# Patient Record
Sex: Male | Born: 1955
Health system: Southern US, Community
[De-identification: ages and names within clinical notes are randomized; demographics above are authoritative.]

## PROBLEM LIST (undated history)

## (undated) DIAGNOSIS — M1990 Unspecified osteoarthritis, unspecified site: Secondary | ICD-10-CM

## (undated) DIAGNOSIS — Q433 Congenital malformations of intestinal fixation: Secondary | ICD-10-CM

## (undated) DIAGNOSIS — K219 Gastro-esophageal reflux disease without esophagitis: Secondary | ICD-10-CM

## (undated) HISTORY — DX: Congenital malformations of intestinal fixation: Q43.3

---

## 2011-03-10 HISTORY — PX: JOINT REPLACEMENT: SHX530

## 2013-06-22 ENCOUNTER — Ambulatory Visit (INDEPENDENT_AMBULATORY_CARE_PROVIDER_SITE_OTHER): Payer: BC Managed Care – PPO | Admitting: Sports Medicine

## 2013-06-22 ENCOUNTER — Encounter: Payer: Self-pay | Admitting: Sports Medicine

## 2013-06-22 VITALS — BP 131/84 | HR 105 | Wt 191.0 lb

## 2013-06-22 DIAGNOSIS — E039 Hypothyroidism, unspecified: Secondary | ICD-10-CM

## 2013-06-22 DIAGNOSIS — Z299 Encounter for prophylactic measures, unspecified: Secondary | ICD-10-CM | POA: Insufficient documentation

## 2013-06-22 DIAGNOSIS — Z136 Encounter for screening for cardiovascular disorders: Secondary | ICD-10-CM

## 2013-06-22 DIAGNOSIS — M1612 Unilateral primary osteoarthritis, left hip: Secondary | ICD-10-CM

## 2013-06-22 DIAGNOSIS — R6882 Decreased libido: Secondary | ICD-10-CM

## 2013-06-22 DIAGNOSIS — E291 Testicular hypofunction: Secondary | ICD-10-CM | POA: Insufficient documentation

## 2013-06-22 DIAGNOSIS — Z96649 Presence of unspecified artificial hip joint: Secondary | ICD-10-CM | POA: Insufficient documentation

## 2013-06-22 DIAGNOSIS — M1610 Unilateral primary osteoarthritis, unspecified hip: Secondary | ICD-10-CM

## 2013-06-22 DIAGNOSIS — M169 Osteoarthritis of hip, unspecified: Secondary | ICD-10-CM

## 2013-06-22 NOTE — Assessment & Plan Note (Addendum)
Status post right total hip arthroplasty. Left total hip arthroplasty scheduled for late December. He does need surgical clearance, he has greater than 4 METS of exercise tolerance, EKG is negative. Femoroacetabular joint injection as above. Return in 4 weeks.

## 2013-06-22 NOTE — Assessment & Plan Note (Signed)
Checking testosterone levels. Can consider 5 phosphodiesterase inhibitors if needed.

## 2013-06-22 NOTE — Progress Notes (Addendum)
  Subjective:    CC: Establish care.   HPI:  Hip osteoarthritis: Status post right total hip arthroplasty, scheduled for left total hip arthroplasty in December, currently has significant pain localized in his groin on the left side, moderate, persistent, and radiation. He does need surgical clearance for hip arthroplasty, he has greater than 4 METS of exercise tolerance without chest pain.  Decreased sex drive: Has trouble with quality, as well as duration of erections. This has occurred since his last hip replacement. He's never had testosterone levels checked, and is somewhat resistant to 5 phosphodiesterase inhibitor treatment.  Preventive measures: Due for lipids, declines flu shot.  Past medical history, Surgical history, Family history not pertinant except as noted below, Social history, Allergies, and medications have been entered into the medical record, reviewed, and no changes needed.   Review of Systems: No headache, visual changes, nausea, vomiting, diarrhea, constipation, dizziness, abdominal pain, skin rash, fevers, chills, night sweats, swollen lymph nodes, weight loss, chest pain, body aches, joint swelling, muscle aches, shortness of breath, mood changes, visual or auditory hallucinations.  Objective:    General: Well Developed, well nourished, and in no acute distress.  Neuro: Alert and oriented x3, extra-ocular muscles intact, sensation grossly intact.  HEENT: Normocephalic, atraumatic, pupils equal round reactive to light, neck supple, no masses, no lymphadenopathy, thyroid nonpalpable.  Skin: Warm and dry, no rashes noted.  Cardiac: Regular rate and rhythm, no murmurs rubs or gallops.  Respiratory: Clear to auscultation bilaterally. Not using accessory muscles, speaking in full sentences.  Abdominal: Soft, nontender, nondistended, positive bowel sounds, no masses, no organomegaly.  Left hip: Internal rotation to approximately 10, painful flexion, as well as internal  rotation.  Procedure: Real-time Ultrasound Guided Injection of left femoral acetabular joint Device: GE Logiq E  Verbal informed consent obtained.  Time-out conducted.   Noted no overlying erythema, induration, or other signs of local infection.  Skin prepped in a sterile fashion.  Local anesthesia: Topical Ethyl chloride.  With sterile technique and under real time ultrasound guidance:  2 cc Kenalog 40, 4 cc lidocaine injected easily at the femoral head/neck junction. Completed without difficulty  Pain immediately resolved suggesting accurate placement of the medication.  Advised to call if fevers/chills, erythema, induration, drainage, or persistent bleeding.  Images permanently stored and available for review in the ultrasound unit.  Impression: Technically successful ultrasound guided injection.  12-lead EKG shows normal sinus rhythm, with no ST changes, normal axis, normal rate.  Impression and Recommendations:    The patient was counselled, risk factors were discussed, anticipatory guidance given.

## 2013-06-24 ENCOUNTER — Telehealth: Payer: Self-pay | Admitting: *Deleted

## 2013-06-24 DIAGNOSIS — Z299 Encounter for prophylactic measures, unspecified: Secondary | ICD-10-CM

## 2013-07-02 DIAGNOSIS — E038 Other specified hypothyroidism: Secondary | ICD-10-CM | POA: Insufficient documentation

## 2013-07-02 DIAGNOSIS — E039 Hypothyroidism, unspecified: Secondary | ICD-10-CM | POA: Insufficient documentation

## 2013-07-02 LAB — COMPREHENSIVE METABOLIC PANEL
Albumin: 4.6 g/dL (ref 3.5–5.2)
CO2: 28 mEq/L (ref 19–32)
Calcium: 9.9 mg/dL (ref 8.4–10.5)
Chloride: 100 mEq/L (ref 96–112)
Glucose, Bld: 95 mg/dL (ref 70–99)
Potassium: 4.4 mEq/L (ref 3.5–5.3)
Sodium: 139 mEq/L (ref 135–145)
Total Protein: 7.9 g/dL (ref 6.0–8.3)

## 2013-07-02 LAB — COMPREHENSIVE METABOLIC PANEL WITH GFR
ALT: 26 U/L (ref 0–53)
AST: 22 U/L (ref 0–37)
Alkaline Phosphatase: 91 U/L (ref 39–117)
BUN: 13 mg/dL (ref 6–23)
Creat: 1.2 mg/dL (ref 0.50–1.35)
Total Bilirubin: 0.7 mg/dL (ref 0.3–1.2)

## 2013-07-02 LAB — CBC
HCT: 44.9 % (ref 39.0–52.0)
Hemoglobin: 15.8 g/dL (ref 13.0–17.0)
MCH: 31.5 pg (ref 26.0–34.0)
MCHC: 35.2 g/dL (ref 30.0–36.0)
MCV: 89.6 fL (ref 78.0–100.0)
Platelets: 280 10*3/uL (ref 150–400)
RBC: 5.01 MIL/uL (ref 4.22–5.81)
RDW: 14.1 % (ref 11.5–15.5)
WBC: 5.7 10*3/uL (ref 4.0–10.5)

## 2013-07-02 LAB — LIPID PANEL
Cholesterol: 197 mg/dL (ref 0–200)
HDL: 45 mg/dL (ref >39)
LDL Cholesterol: 129 mg/dL — ABNORMAL HIGH (ref 0–99)
Total CHOL/HDL Ratio: 4.4 ratio
Triglycerides: 115 mg/dL (ref <150)
VLDL: 23 mg/dL (ref 0–40)

## 2013-07-02 LAB — TSH: TSH: 4.606 u[IU]/mL — ABNORMAL HIGH (ref 0.350–4.500)

## 2013-07-05 LAB — TESTOSTERONE, FREE, TOTAL, SHBG
Sex Hormone Binding: 30 nmol/L (ref 13–71)
Testosterone, Free: 54.2 pg/mL (ref 47.0–244.0)
Testosterone-% Free: 2.1 % (ref 1.6–2.9)
Testosterone: 264 ng/dL — ABNORMAL LOW (ref 300–890)

## 2013-07-06 ENCOUNTER — Ambulatory Visit (INDEPENDENT_AMBULATORY_CARE_PROVIDER_SITE_OTHER): Payer: BC Managed Care – PPO | Admitting: Sports Medicine

## 2013-07-06 ENCOUNTER — Encounter: Payer: Self-pay | Admitting: Sports Medicine

## 2013-07-06 VITALS — BP 126/83 | HR 81 | Wt 187.0 lb

## 2013-07-06 DIAGNOSIS — M169 Osteoarthritis of hip, unspecified: Secondary | ICD-10-CM

## 2013-07-06 DIAGNOSIS — E291 Testicular hypofunction: Secondary | ICD-10-CM

## 2013-07-06 DIAGNOSIS — M1612 Unilateral primary osteoarthritis, left hip: Secondary | ICD-10-CM

## 2013-07-06 DIAGNOSIS — E039 Hypothyroidism, unspecified: Secondary | ICD-10-CM

## 2013-07-06 DIAGNOSIS — M1610 Unilateral primary osteoarthritis, unspecified hip: Secondary | ICD-10-CM

## 2013-07-06 DIAGNOSIS — E038 Other specified hypothyroidism: Secondary | ICD-10-CM

## 2013-07-06 MED ORDER — TESTOSTERONE 2 MG/24HR TD PT24: 1.0000 | MEDICATED_PATCH | Freq: Every morning | TRANSDERMAL | Status: DC

## 2013-07-06 NOTE — Assessment & Plan Note (Signed)
Pain is essentially resolved after injection, he can return as needed for repeat injection. He is scheduled for total hip arthroplasty on December 17.

## 2013-07-06 NOTE — Assessment & Plan Note (Signed)
Starting testosterone supplementation, topical patch. Prescribing Androderm, we may need to do a prior authorization. I would like to do one month of Androderm, and then recheck testosterone levels.

## 2013-07-06 NOTE — Progress Notes (Signed)
  Subjective:    CC:  Follow up  HPI: Hypogonadism: There he does have decreased sex drive, poor erections, we checked his testosterone levels and they were low. He does desire to initiate supplementation. He desires a topical patch.  Subclinical hypothyroidism: TSH was high, but he does not endorse any symptoms of fatigue, constipation, hair loss, cold intolerance.  Left hip osteoarthritis: scheduled for total hip arthroplasty in December, continues to be pain-free after femoroacetabular joint injection.  Past medical history, Surgical history, Family history not pertinant except as noted below, Social history, Allergies, and medications have been entered into the medical record, reviewed, and no changes needed.   Review of Systems: No fevers, chills, night sweats, weight loss, chest pain, or shortness of breath.   Objective:    General: Well Developed, well nourished, and in no acute distress.  Neuro: Alert and oriented x3, extra-ocular muscles intact, sensation grossly intact.  HEENT: Normocephalic, atraumatic, pupils equal round reactive to light, neck supple, no masses, no lymphadenopathy, thyroid nonpalpable.  Skin: Warm and dry, no rashes. Cardiac: Regular rate and rhythm, no murmurs rubs or gallops, no lower extremity edema.  Respiratory: Clear to auscultation bilaterally. Not using accessory muscles, speaking in full sentences.  Impression and Recommendations:

## 2013-07-06 NOTE — Assessment & Plan Note (Signed)
Currently asymptomatic, I would recommend that we do not treat this until symptomatic.

## 2013-07-20 ENCOUNTER — Ambulatory Visit (INDEPENDENT_AMBULATORY_CARE_PROVIDER_SITE_OTHER): Payer: BC Managed Care – PPO

## 2013-07-20 ENCOUNTER — Encounter: Payer: Self-pay | Admitting: Sports Medicine

## 2013-07-20 ENCOUNTER — Ambulatory Visit (INDEPENDENT_AMBULATORY_CARE_PROVIDER_SITE_OTHER): Payer: BC Managed Care – PPO | Admitting: Sports Medicine

## 2013-07-20 VITALS — BP 144/89 | HR 108 | Wt 188.0 lb

## 2013-07-20 DIAGNOSIS — M47812 Spondylosis without myelopathy or radiculopathy, cervical region: Secondary | ICD-10-CM | POA: Insufficient documentation

## 2013-07-20 DIAGNOSIS — M5030 Other cervical disc degeneration, unspecified cervical region: Secondary | ICD-10-CM

## 2013-07-20 DIAGNOSIS — E291 Testicular hypofunction: Secondary | ICD-10-CM

## 2013-07-20 DIAGNOSIS — M1612 Unilateral primary osteoarthritis, left hip: Secondary | ICD-10-CM

## 2013-07-20 DIAGNOSIS — M169 Osteoarthritis of hip, unspecified: Secondary | ICD-10-CM

## 2013-07-20 DIAGNOSIS — M1610 Unilateral primary osteoarthritis, unspecified hip: Secondary | ICD-10-CM

## 2013-07-20 DIAGNOSIS — M5380 Other specified dorsopathies, site unspecified: Secondary | ICD-10-CM

## 2013-07-20 DIAGNOSIS — M542 Cervicalgia: Secondary | ICD-10-CM

## 2013-07-20 MED ORDER — TESTOSTERONE CYPIONATE 200 MG/ML IM SOLN
200.0000 mg | INTRAMUSCULAR | Status: DC
  Administered 2013-07-20: 200 mg via INTRAMUSCULAR

## 2013-07-20 NOTE — Assessment & Plan Note (Signed)
Last injection was almost 3 months ago, he is starting to have 50% return of his pain. Repeat femoral acetabular joint injection as above. Total hip arthroplasty is scheduled for the middle of December.

## 2013-07-20 NOTE — Assessment & Plan Note (Signed)
Testosterone patches were too expensive. Switching to injections every 2 weeks. We will inject him twice, and then recheck testosterone levels one week after his second injection.

## 2013-07-20 NOTE — Assessment & Plan Note (Signed)
This likely represents upper cervical degenerative disc disease. X-rays. He has been through a full course of chiropractic care, with no improvement. I do suspect he would be a candidate for interventional injections. He's not yet ready to proceed with MRI but when he is, I am happy to order it, he will need 10 mg of Valium prior to the procedure.

## 2013-07-20 NOTE — Progress Notes (Signed)
  Subjective:    CC: Follow up  HPI: Left hip osteoarthritis: Has an arthroplasty scheduled for December. Unfortunately he is starting to have a recurrence of pain approximately 2 and half months after his last injection. He desires repeat interventional treatment.  Male hypogonadism:  Was unable to afford Androderm, he does desire to proceed with testosterone injections.  Neck pain: Gets a grinding sensation, pain radiates down both shoulder blades. Moderate, persistent.  Past medical history, Surgical history, Family history not pertinant except as noted below, Social history, Allergies, and medications have been entered into the medical record, reviewed, and no changes needed.   Review of Systems: No fevers, chills, night sweats, weight loss, chest pain, or shortness of breath.   Objective:    General: Well Developed, well nourished, and in no acute distress.  Neuro: Alert and oriented x3, extra-ocular muscles intact, sensation grossly intact.  HEENT: Normocephalic, atraumatic, pupils equal round reactive to light, neck supple, no masses, no lymphadenopathy, thyroid nonpalpable.  Skin: Warm and dry, no rashes. Cardiac: Regular rate and rhythm, no murmurs rubs or gallops, no lower extremity edema.  Respiratory: Clear to auscultation bilaterally. Not using accessory muscles, speaking in full sentences. Neck: Inspection unremarkable. No palpable stepoffs. Negative Spurling's maneuver. Full neck range of motion Grip strength and sensation normal in bilateral hands Strength good C4 to T1 distribution No sensory change to C4 to T1 Negative Hoffman sign bilaterally Reflexes normal  Procedure: Real-time Ultrasound Guided Injection of left hip Device: GE Logiq E  Verbal informed consent obtained.  Time-out conducted.  Noted no overlying erythema, induration, or other signs of local infection.  Skin prepped in a sterile fashion.  Local anesthesia: Topical Ethyl chloride.  With  sterile technique and under real time ultrasound guidance:  Spinal needle advanced to the femoral head/neck junction, 2 cc Kenalog 40, 4 cc lidocaine injected easily into the joint. Completed without difficulty  Pain immediately resolved suggesting accurate placement of the medication.  Advised to call if fevers/chills, erythema, induration, drainage, or persistent bleeding.  Images permanently stored and available for review in the ultrasound unit.  Impression: Technically successful ultrasound guided injection.  Impression and Recommendations:

## 2013-07-29 ENCOUNTER — Ambulatory Visit (INDEPENDENT_AMBULATORY_CARE_PROVIDER_SITE_OTHER): Payer: BC Managed Care – PPO | Admitting: Sports Medicine

## 2013-07-29 ENCOUNTER — Encounter: Payer: Self-pay | Admitting: Sports Medicine

## 2013-07-29 VITALS — BP 125/75 | HR 90 | Wt 188.0 lb

## 2013-07-29 DIAGNOSIS — M1612 Unilateral primary osteoarthritis, left hip: Secondary | ICD-10-CM

## 2013-07-29 DIAGNOSIS — M1610 Unilateral primary osteoarthritis, unspecified hip: Secondary | ICD-10-CM

## 2013-07-29 DIAGNOSIS — M542 Cervicalgia: Secondary | ICD-10-CM

## 2013-07-29 DIAGNOSIS — M169 Osteoarthritis of hip, unspecified: Secondary | ICD-10-CM

## 2013-07-29 DIAGNOSIS — E291 Testicular hypofunction: Secondary | ICD-10-CM

## 2013-07-29 MED ORDER — TESTOSTERONE CYPIONATE 200 MG/ML IM SOLN
200.0000 mg | INTRAMUSCULAR | Status: DC
  Administered 2013-07-29: 200 mg via INTRAMUSCULAR

## 2013-07-29 MED ORDER — CYCLOBENZAPRINE HCL 10 MG PO TABS: ORAL_TABLET | ORAL | Status: DC

## 2013-07-29 MED ORDER — MELOXICAM 15 MG PO TABS: ORAL_TABLET | ORAL | Status: DC

## 2013-07-29 NOTE — Progress Notes (Signed)
  Subjective:    CC: Followup  HPI: Neck pain: Gets a grinding sensation in the upper cervical spine as well as pain in the morning. Symptoms radiate down into both shoulder blades, but not into the arms. Symptoms are moderate, persistent.  Hypogonadism:  Due for testosterone injection today.  Left hip osteoarthritis: Doing well after injection, still scheduled for arthroplasty.  Past medical history, Surgical history, Family history not pertinant except as noted below, Social history, Allergies, and medications have been entered into the medical record, reviewed, and no changes needed.   Review of Systems: No fevers, chills, night sweats, weight loss, chest pain, or shortness of breath.   Objective:    General: Well Developed, well nourished, and in no acute distress.  Neuro: Alert and oriented x3, extra-ocular muscles intact, sensation grossly intact.  HEENT: Normocephalic, atraumatic, pupils equal round reactive to light, neck supple, no masses, no lymphadenopathy, thyroid nonpalpable.  Skin: Warm and dry, no rashes. Cardiac: Regular rate and rhythm, no murmurs rubs or gallops, no lower extremity edema.  Respiratory: Clear to auscultation bilaterally. Not using accessory muscles, speaking in full sentences. Neck: Inspection unremarkable. No palpable stepoffs. Negative Spurling's maneuver. Full neck range of motion Grip strength and sensation normal in bilateral hands Strength good C4 to T1 distribution No sensory change to C4 to T1 Negative Hoffman sign bilaterally Reflexes normal  X-ray showed mild C5-C6 degenerative disc disease.  Impression and Recommendations:

## 2013-07-29 NOTE — Assessment & Plan Note (Addendum)
Hip is doing fantastic after injection.

## 2013-07-29 NOTE — Assessment & Plan Note (Signed)
Mobic, Flexeril, home exercises. Return in one month, MRI for interventional injection planning if no better, he likely has some facet spondylosis.

## 2013-07-29 NOTE — Assessment & Plan Note (Signed)
Testosterone injection as above. 

## 2013-08-10 ENCOUNTER — Other Ambulatory Visit: Payer: Self-pay | Admitting: Orthopedic Surgery

## 2013-08-10 NOTE — Progress Notes (Signed)
Preoperative surgical orders have been place into the Epic hospital system for Benjamin Ballard on 08/10/2013, 12:58 PM  by Patrica Duel for surgery on 08-25-2013.  Preop Total Hip - Anterior Approach orders including Experel Injecion, PO Tylenol, and IV Decadron as long as there are no contraindications to the above medications. Avel Peace, PA-C

## 2013-08-12 ENCOUNTER — Ambulatory Visit (INDEPENDENT_AMBULATORY_CARE_PROVIDER_SITE_OTHER): Payer: BC Managed Care – PPO | Admitting: Sports Medicine

## 2013-08-12 ENCOUNTER — Encounter: Payer: Self-pay | Admitting: *Deleted

## 2013-08-12 VITALS — BP 134/81 | HR 96

## 2013-08-12 DIAGNOSIS — E291 Testicular hypofunction: Secondary | ICD-10-CM

## 2013-08-12 MED ORDER — TESTOSTERONE CYPIONATE 200 MG/ML IM SOLN
200.0000 mg | Freq: Once | INTRAMUSCULAR | Status: AC
  Administered 2013-08-12: 200 mg via INTRAMUSCULAR

## 2013-08-12 NOTE — Assessment & Plan Note (Signed)
Testosterone injection as above. 

## 2013-08-12 NOTE — Progress Notes (Signed)
   Subjective:    Patient ID: Benjamin Ballard, male    DOB: 03-Sep-1956, 57 y.o.   MRN: [id number] Testosterone given LUOQ 200mg .  No complications.  Donne Anon, CMA HPI    Review of Systems     Objective:   Physical Exam        Assessment & Plan:

## 2013-08-16 ENCOUNTER — Encounter (HOSPITAL_COMMUNITY): Payer: Self-pay | Admitting: Pharmacy Technician

## 2013-08-18 ENCOUNTER — Encounter (HOSPITAL_COMMUNITY): Payer: Self-pay

## 2013-08-18 ENCOUNTER — Encounter (HOSPITAL_COMMUNITY)
Admission: RE | Admit: 2013-08-18 | Discharge: 2013-08-18 | Disposition: A | Discharge status: Home / Self Care | Payer: BC Managed Care – PPO | Source: Ambulatory Visit | Attending: Orthopedic Surgery | Admitting: Orthopedic Surgery

## 2013-08-18 ENCOUNTER — Other Ambulatory Visit (HOSPITAL_COMMUNITY): Payer: Self-pay | Admitting: Orthopedic Surgery

## 2013-08-18 ENCOUNTER — Ambulatory Visit (HOSPITAL_COMMUNITY)
Admission: RE | Admit: 2013-08-18 | Discharge: 2013-08-18 | Disposition: A | Discharge status: Home / Self Care | Payer: BC Managed Care – PPO | Source: Ambulatory Visit | Attending: Orthopedic Surgery | Admitting: Orthopedic Surgery

## 2013-08-18 DIAGNOSIS — M76899 Other specified enthesopathies of unspecified lower limb, excluding foot: Secondary | ICD-10-CM | POA: Insufficient documentation

## 2013-08-18 DIAGNOSIS — I998 Other disorder of circulatory system: Secondary | ICD-10-CM | POA: Insufficient documentation

## 2013-08-18 DIAGNOSIS — Z96649 Presence of unspecified artificial hip joint: Secondary | ICD-10-CM | POA: Insufficient documentation

## 2013-08-18 DIAGNOSIS — Z01818 Encounter for other preprocedural examination: Secondary | ICD-10-CM | POA: Insufficient documentation

## 2013-08-18 DIAGNOSIS — M1610 Unilateral primary osteoarthritis, unspecified hip: Secondary | ICD-10-CM | POA: Insufficient documentation

## 2013-08-18 DIAGNOSIS — Z01812 Encounter for preprocedural laboratory examination: Secondary | ICD-10-CM | POA: Insufficient documentation

## 2013-08-18 DIAGNOSIS — M169 Osteoarthritis of hip, unspecified: Secondary | ICD-10-CM | POA: Insufficient documentation

## 2013-08-18 HISTORY — DX: Unspecified osteoarthritis, unspecified site: M19.90

## 2013-08-18 HISTORY — DX: Gastro-esophageal reflux disease without esophagitis: K21.9

## 2013-08-18 LAB — URINALYSIS, ROUTINE W REFLEX MICROSCOPIC
Bilirubin Urine: NEGATIVE
Glucose, UA: NEGATIVE mg/dL
Hgb urine dipstick: NEGATIVE
Leukocytes, UA: NEGATIVE
Specific Gravity, Urine: 1.007 (ref 1.005–1.030)
Urobilinogen, UA: 0.2 mg/dL (ref 0.0–1.0)

## 2013-08-18 LAB — COMPREHENSIVE METABOLIC PANEL
ALT: 24 U/L (ref 0–53)
Albumin: 4.3 g/dL (ref 3.5–5.2)
Alkaline Phosphatase: 92 U/L (ref 39–117)
BUN: 9 mg/dL (ref 6–23)
CO2: 27 mEq/L (ref 19–32)
Chloride: 102 mEq/L (ref 96–112)
Creatinine, Ser: 0.97 mg/dL (ref 0.50–1.35)
Glucose, Bld: 81 mg/dL (ref 70–99)
Potassium: 4.1 mEq/L (ref 3.5–5.1)
Sodium: 138 mEq/L (ref 135–145)
Total Bilirubin: 0.4 mg/dL (ref 0.3–1.2)
Total Protein: 7.6 g/dL (ref 6.0–8.3)

## 2013-08-18 LAB — CBC
HCT: 47.2 % (ref 39.0–52.0)
Hemoglobin: 15.8 g/dL (ref 13.0–17.0)
MCHC: 33.5 g/dL (ref 30.0–36.0)
MCV: 95.2 fL (ref 78.0–100.0)
RDW: 13.7 % (ref 11.5–15.5)
WBC: 6.5 10*3/uL (ref 4.0–10.5)

## 2013-08-18 LAB — APTT: aPTT: 31 seconds (ref 24–37)

## 2013-08-18 LAB — SURGICAL PCR SCREEN
MRSA, PCR: NEGATIVE
Staphylococcus aureus: POSITIVE — AB

## 2013-08-18 NOTE — Patient Instructions (Addendum)
20 LEVELL TAVANO  08/18/2013   Your procedure is scheduled on: Wednesday December 17th  Report to [facility] at 630  AM.  Call this number if you have problems the morning of surgery [phone]   Remember:   Do not eat food or drink liquids :After Midnight.     Take these medicines the morning of surgery with A SIP OF WATER: no meds to take                                SEE [place] PREPARING FOR SURGERY SHEET             You may not have any metal on your body including hair pins and piercings  Do not wear jewelry, make-up.  Do not wear lotions, powders, or perfumes. You may wear deodorant.   Men may shave face and neck.  Do not bring valuables to the hospital. [facility] IS NOT RESPONSIBLE FOR VALUEABLES.  Contacts, dentures or bridgework may not be worn into surgery.  Leave suitcase in the car. After surgery it may be brought to your room.  For patients admitted to the hospital, checkout time is 11:00 AM the day of discharge.    Please read over the following fact sheets that you were given:                          [facility] Preparing for Surgery sheet                          MRSA Information, Incentive Spirometer sheet                           Blood Fact Sheet   Call Cain Sieve RN pre op nurse if needed [phone]    FAILURE TO FOLLOW THESE INSTRUCTIONS MAY RESULT IN THE CANCELLATION OF YOUR SURGERY.  PATIENT SIGNATURE___________________________________________  NURSE SIGNATURE_____________________________________________

## 2013-08-24 ENCOUNTER — Other Ambulatory Visit: Payer: Self-pay | Admitting: Orthopedic Surgery

## 2013-08-24 NOTE — H&P (Signed)
Benjamin Ballard  DOB: 08/28/1956 Married / Language: English / Race: White Male  Date of Admission:  08-25-2013  Chief Complaint:  Left Hip Pain  History of Present Illness The patient is a 57 year old male who comes in for a preoperative History and Physical. The patient is scheduled for a left total hip arthroplasty (anterior approach) to be performed by Dr. Frank V. Aluisio, MD at [facility] on 08/25/2013.  The patient is a 57 year old male who presents with a hip problem. The patient reports left hip problems including pain symptoms that have been present for several month(s). The symptoms began without any known injury. Symptoms reported include hip pain, catching and difficulty ambulating The patient does not report any radiation of symptoms. Onset of symptoms was gradual.The patient feels as if their symptoms are does feel they are worsening. Symptoms are exacerbated by lying on the affected side. Current treatment includes nonsteroidal anti-inflammatory drugs (occasionally).  The patient previously had right hip resurfacing by Dr. Ward when he was still at [facility]. The patient states he did very well with that. He recovered quickly. He is having similar symptoms now in his left hip. He is having pain with most activities. He even has pain at rest. It is starting to limit what he can and cannot do. He is at a stage where he is wanting to pursue treatment for his left hip. He is ready to proceed with hip repalcement. They have been treated conservatively in the past for the above stated problem and despite conservative measures, they continue to have progressive pain and severe functional limitations and dysfunction. They have failed non-operative management including home exercise, medications, and injections. It is felt that they would benefit from undergoing total joint replacement. Risks and benefits of the procedure have been discussed with the patient and they  elect to proceed with surgery. There are no active contraindications to surgery such as ongoing infection or rapidly progressive neurological disease.    Problem List/Past Medical Osteoarthritis, Hip (715.35) Hip pain (719.45)    Allergies OxyCODONE HCl *ANALGESICS - OPIOID*. Difficulty breathing, Rash. Penicillins. childhood reaction    Family History Father. Deceased. Colon Cancer Mother. Living. Heart Attack, Cardiac Stents    Social History Number of flights of stairs before winded. 2-3 Pain Contract. no Tobacco use. Never smoker. never smoker Marital status. married Children. 1 Current work status. working full time Alcohol use. never consumed alcohol Drug/Alcohol Rehab (Currently). no Illicit drug use. no Living situation. live with spouse Drug/Alcohol Rehab (Previously). no Exercise. Exercises weekly; does other    Medication History Multivitamins ( Oral) Active. Goodys Extra Strength ( Oral) Specific dose unknown - Active. Vitamin C ( Oral) Active.    Past Surgical History Right hip resurfacing by Dr. Ward in July 2012   Review of Systems General:Not Present- Chills, Fever, Night Sweats, Fatigue, Weight Gain, Weight Loss and Memory Loss. Skin:Not Present- Hives, Itching, Rash, Eczema and Lesions. HEENT:Not Present- Tinnitus, Headache, Double Vision, Visual Loss, Hearing Loss and Dentures. Respiratory:Not Present- Shortness of breath with exertion, Shortness of breath at rest, Allergies, Coughing up blood and Chronic Cough. Cardiovascular:Not Present- Chest Pain, Racing/skipping heartbeats, Difficulty Breathing Lying Down, Murmur, Swelling and Palpitations. Gastrointestinal:Not Present- Bloody Stool, Heartburn, Abdominal Pain, Vomiting, Nausea, Constipation, Diarrhea, Difficulty Swallowing, Jaundice and Loss of appetitie. Male Genitourinary:Not Present- Urinary frequency, Blood in Urine, Weak urinary stream, Discharge,  Flank Pain, Incontinence, Painful Urination, Urgency, Urinary Retention and Urinating at Night. Musculoskeletal:Present- Joint Pain. Not   Present- Muscle Weakness, Muscle Pain, Joint Swelling, Back Pain, Morning Stiffness and Spasms. Neurological:Not Present- Tremor, Dizziness, Blackout spells, Paralysis, Difficulty with balance and Weakness. Psychiatric:Not Present- Insomnia.    Vitals Weight: 188 lb Height: 73 in Weight was reported by patient. Height was reported by patient. Body Surface Area: 2.1 m Body Mass Index: 24.8 kg/m Pulse: 88 (Regular) Resp.: 14 (Unlabored) BP: 124/78 (Sitting, Left Arm, Standard)   Physical Exam The physical exam findings are as follows:  Note: Patient is a 57 year old male with continued hip pain.   General Mental Status - Alert, cooperative and good historian. General Appearance- pleasant. Not in acute distress. Orientation- Oriented X3. Build & Nutrition- Well nourished and Well developed.   Head and Neck Head- normocephalic, atraumatic . Neck Global Assessment- supple. no bruit auscultated on the right and no bruit auscultated on the left.   Eye Vision- Wears corrective lenses. Pupil- Bilateral- Regular and Round. Motion- Bilateral- EOMI.   Chest and Lung Exam Auscultation: Breath sounds:- clear at anterior chest wall and - clear at posterior chest wall. Adventitious sounds:- No Adventitious sounds.   Cardiovascular Auscultation:Rhythm- Regular rate and rhythm (with an occassional "ectopic" beat heard on exam). Heart Sounds- S1 WNL and S2 WNL. Murmurs & Other Heart Sounds:Auscultation of the heart reveals - No Murmurs.   Abdomen Palpation/Percussion:Tenderness- Abdomen is non-tender to palpation. Rigidity (guarding)- Abdomen is soft. Auscultation:Auscultation of the abdomen reveals - Bowel sounds normal.   Male Genitourinary Not done, not pertinent to present  illness  Musculoskeletal On exam well developed male, alert, and oriented in no apparent distress. Evaluation of his right hip flexion about 100, rotation in 20, out 30, and abduction 30 without discomfort. The left hip flexion 90, no internal rotation, about 5 degrees external rotation, about 5-10 degrees of abduction. His gait pattern is antalgic on the left. Pulse sensation and motor intact both lower extremities.  RADIOGRAPHS: AP pelvis and lateral of the left hip show the resurfacing in good position on the right. On the left he has bone on bone arthritis throughout the hip. There is flattening of the femoral head and osteophyte formation.   Assessment & Plan Osteoarthritis, Hip (715.35) Impression: Left Hip  Note: Plan is for a LeftTotal Hip Replacement - Anterior Approach by Dr. Aluisio.  Plan is to go home.  PCP - Dr. Tekkekandon - Patient has been seen preoperatively and felt to be stable for surgery.  The patient does not have any contraindications and will receive TXA (tranexamic acid) prior to surgery.  Signed electronically by Rameen Quinney L Jashawna Reever, III PA-C  

## 2013-08-24 NOTE — Anesthesia Preprocedure Evaluation (Addendum)
Anesthesia Evaluation  Patient identified by MRN, date of birth, ID band Patient awake    Reviewed: Allergy & Precautions, H&P , NPO status , Patient's Chart, lab work & pertinent test results  Airway Mallampati: II TM Distance: >3 FB Neck ROM: Full    Dental  (+) Teeth Intact and Dental Advisory Given   Pulmonary neg pulmonary ROS,  breath sounds clear to auscultation  Pulmonary exam normal       Cardiovascular negative cardio ROS  Rhythm:Regular Rate:Normal     Neuro/Psych negative neurological ROS  negative psych ROS   GI/Hepatic Neg liver ROS, GERD-  ,  Endo/Other  negative endocrine ROS  Renal/GU negative Renal ROS  negative genitourinary   Musculoskeletal  (+) Arthritis -,   Abdominal   Peds  Hematology negative hematology ROS (+)   Anesthesia Other Findings   Reproductive/Obstetrics                          Anesthesia Physical Anesthesia Plan  ASA: I  Anesthesia Plan: General   Post-op Pain Management:    Induction: Intravenous  Airway Management Planned: Oral ETT  Additional Equipment:   Intra-op Plan:   Post-operative Plan: Extubation in OR  Informed Consent: I have reviewed the patients History and Physical, chart, labs and discussed the procedure including the risks, benefits and alternatives for the proposed anesthesia with the patient or authorized representative who has indicated his/her understanding and acceptance.   Dental advisory given  Plan Discussed with: CRNA  Anesthesia Plan Comments:        Anesthesia Quick Evaluation

## 2013-08-25 ENCOUNTER — Inpatient Hospital Stay (HOSPITAL_COMMUNITY): Payer: BC Managed Care – PPO

## 2013-08-25 ENCOUNTER — Inpatient Hospital Stay (HOSPITAL_COMMUNITY)
Admission: RE | Admit: 2013-08-25 | Discharge: 2013-08-26 | DRG: 470 | Disposition: A | Discharge status: Home Health | Payer: BC Managed Care – PPO | Source: Ambulatory Visit | Attending: Orthopedic Surgery | Admitting: Orthopedic Surgery

## 2013-08-25 ENCOUNTER — Encounter (HOSPITAL_COMMUNITY)
Admission: RE | Disposition: A | Discharge status: Home Health | Payer: Self-pay | Source: Ambulatory Visit | Attending: Orthopedic Surgery

## 2013-08-25 ENCOUNTER — Encounter (HOSPITAL_COMMUNITY): Payer: Self-pay | Admitting: *Deleted

## 2013-08-25 ENCOUNTER — Encounter (HOSPITAL_COMMUNITY): Payer: BC Managed Care – PPO | Admitting: Anesthesiology

## 2013-08-25 ENCOUNTER — Inpatient Hospital Stay (HOSPITAL_COMMUNITY): Payer: BC Managed Care – PPO | Admitting: Anesthesiology

## 2013-08-25 DIAGNOSIS — M1610 Unilateral primary osteoarthritis, unspecified hip: Principal | ICD-10-CM | POA: Diagnosis present

## 2013-08-25 DIAGNOSIS — K219 Gastro-esophageal reflux disease without esophagitis: Secondary | ICD-10-CM | POA: Diagnosis present

## 2013-08-25 DIAGNOSIS — Z96649 Presence of unspecified artificial hip joint: Secondary | ICD-10-CM

## 2013-08-25 DIAGNOSIS — E871 Hypo-osmolality and hyponatremia: Secondary | ICD-10-CM | POA: Diagnosis not present

## 2013-08-25 DIAGNOSIS — Z79899 Other long term (current) drug therapy: Secondary | ICD-10-CM

## 2013-08-25 DIAGNOSIS — Z880 Allergy status to penicillin: Secondary | ICD-10-CM

## 2013-08-25 DIAGNOSIS — Z800 Family history of malignant neoplasm of digestive organs: Secondary | ICD-10-CM

## 2013-08-25 DIAGNOSIS — D62 Acute posthemorrhagic anemia: Secondary | ICD-10-CM | POA: Diagnosis not present

## 2013-08-25 DIAGNOSIS — Z8249 Family history of ischemic heart disease and other diseases of the circulatory system: Secondary | ICD-10-CM

## 2013-08-25 DIAGNOSIS — M169 Osteoarthritis of hip, unspecified: Secondary | ICD-10-CM | POA: Diagnosis present

## 2013-08-25 HISTORY — PX: TOTAL HIP ARTHROPLASTY: SHX124

## 2013-08-25 LAB — TYPE AND SCREEN
ABO/RH(D): O POS
Antibody Screen: NEGATIVE

## 2013-08-25 LAB — ABO/RH: ABO/RH(D): O POS

## 2013-08-25 SURGERY — ARTHROPLASTY, HIP, TOTAL, ANTERIOR APPROACH
Anesthesia: General | Site: Hip | Laterality: Left

## 2013-08-25 MED ORDER — DEXAMETHASONE SODIUM PHOSPHATE 10 MG/ML IJ SOLN: 10.0000 mg | Freq: Once | INTRAMUSCULAR | Status: DC

## 2013-08-25 MED ORDER — CEFAZOLIN SODIUM-DEXTROSE 2-3 GM-% IV SOLR
INTRAVENOUS | Status: AC
  Filled 2013-08-25: qty 50

## 2013-08-25 MED ORDER — MIDAZOLAM HCL 5 MG/5ML IJ SOLN
INTRAMUSCULAR | Status: DC | PRN
  Administered 2013-08-25: 2 mg via INTRAVENOUS

## 2013-08-25 MED ORDER — ACETAMINOPHEN 500 MG PO TABS
1000.0000 mg | ORAL_TABLET | Freq: Four times a day (QID) | ORAL | Status: DC
  Administered 2013-08-25 – 2013-08-26 (×3): 1000 mg via ORAL
  Filled 2013-08-25 (×3): qty 2

## 2013-08-25 MED ORDER — ONDANSETRON HCL 4 MG/2ML IJ SOLN: 4.0000 mg | Freq: Four times a day (QID) | INTRAMUSCULAR | Status: DC | PRN

## 2013-08-25 MED ORDER — BUPIVACAINE HCL 0.25 % IJ SOLN
INTRAMUSCULAR | Status: DC | PRN
  Administered 2013-08-25: 20 mL

## 2013-08-25 MED ORDER — DOCUSATE SODIUM 100 MG PO CAPS
100.0000 mg | ORAL_CAPSULE | Freq: Two times a day (BID) | ORAL | Status: DC
  Administered 2013-08-25 – 2013-08-26 (×2): 100 mg via ORAL

## 2013-08-25 MED ORDER — GLYCOPYRROLATE 0.2 MG/ML IJ SOLN
INTRAMUSCULAR | Status: AC
  Filled 2013-08-25: qty 2

## 2013-08-25 MED ORDER — LACTATED RINGERS IV SOLN
INTRAVENOUS | Status: DC | PRN
  Administered 2013-08-25 (×2): via INTRAVENOUS

## 2013-08-25 MED ORDER — BISACODYL 10 MG RE SUPP: 10.0000 mg | Freq: Every day | RECTAL | Status: DC | PRN

## 2013-08-25 MED ORDER — FENTANYL CITRATE 0.05 MG/ML IJ SOLN
INTRAMUSCULAR | Status: AC
  Filled 2013-08-25: qty 5

## 2013-08-25 MED ORDER — HYDROMORPHONE HCL PF 1 MG/ML IJ SOLN
INTRAMUSCULAR | Status: AC
  Filled 2013-08-25: qty 1

## 2013-08-25 MED ORDER — METOCLOPRAMIDE HCL 5 MG/ML IJ SOLN: 5.0000 mg | Freq: Three times a day (TID) | INTRAMUSCULAR | Status: DC | PRN

## 2013-08-25 MED ORDER — GLYCOPYRROLATE 0.2 MG/ML IJ SOLN
INTRAMUSCULAR | Status: DC | PRN
  Administered 2013-08-25: 0.6 mg via INTRAVENOUS

## 2013-08-25 MED ORDER — MENTHOL 3 MG MT LOZG: 1.0000 | LOZENGE | OROMUCOSAL | Status: DC | PRN

## 2013-08-25 MED ORDER — TRANEXAMIC ACID 100 MG/ML IV SOLN
1000.0000 mg | INTRAVENOUS | Status: AC
  Administered 2013-08-25: 1000 mg via INTRAVENOUS
  Filled 2013-08-25: qty 10

## 2013-08-25 MED ORDER — MIDAZOLAM HCL 2 MG/2ML IJ SOLN
INTRAMUSCULAR | Status: AC
  Filled 2013-08-25: qty 2

## 2013-08-25 MED ORDER — LIDOCAINE HCL (CARDIAC) 20 MG/ML IV SOLN
INTRAVENOUS | Status: AC
  Filled 2013-08-25: qty 5

## 2013-08-25 MED ORDER — ROCURONIUM BROMIDE 100 MG/10ML IV SOLN
INTRAVENOUS | Status: DC | PRN
  Administered 2013-08-25: 30 mg via INTRAVENOUS

## 2013-08-25 MED ORDER — ONDANSETRON HCL 4 MG/2ML IJ SOLN
INTRAMUSCULAR | Status: DC | PRN
  Administered 2013-08-25: 4 mg via INTRAVENOUS

## 2013-08-25 MED ORDER — DEXAMETHASONE SODIUM PHOSPHATE 10 MG/ML IJ SOLN
10.0000 mg | Freq: Every day | INTRAMUSCULAR | Status: AC
  Filled 2013-08-25: qty 1

## 2013-08-25 MED ORDER — 0.9 % SODIUM CHLORIDE (POUR BTL) OPTIME
TOPICAL | Status: DC | PRN
  Administered 2013-08-25: 1000 mL

## 2013-08-25 MED ORDER — CEFAZOLIN SODIUM-DEXTROSE 2-3 GM-% IV SOLR
2.0000 g | Freq: Four times a day (QID) | INTRAVENOUS | Status: AC
Start: 2013-08-25 — End: 2013-08-25
  Administered 2013-08-25 (×2): 2 g via INTRAVENOUS
  Filled 2013-08-25 (×2): qty 50

## 2013-08-25 MED ORDER — MORPHINE SULFATE 2 MG/ML IJ SOLN: 1.0000 mg | INTRAMUSCULAR | Status: DC | PRN

## 2013-08-25 MED ORDER — DEXTROSE-NACL 5-0.9 % IV SOLN
INTRAVENOUS | Status: DC
  Administered 2013-08-25: 15:00:00 via INTRAVENOUS

## 2013-08-25 MED ORDER — SODIUM CHLORIDE 0.9 % IV SOLN: INTRAVENOUS | Status: DC

## 2013-08-25 MED ORDER — SODIUM CHLORIDE 0.9 % IJ SOLN
INTRAMUSCULAR | Status: AC
  Filled 2013-08-25: qty 50

## 2013-08-25 MED ORDER — POLYETHYLENE GLYCOL 3350 17 G PO PACK: 17.0000 g | PACK | Freq: Every day | ORAL | Status: DC | PRN

## 2013-08-25 MED ORDER — ACETAMINOPHEN 500 MG PO TABS
1000.0000 mg | ORAL_TABLET | Freq: Once | ORAL | Status: AC
  Administered 2013-08-25: 1000 mg via ORAL
  Filled 2013-08-25: qty 2

## 2013-08-25 MED ORDER — METHOCARBAMOL 100 MG/ML IJ SOLN
500.0000 mg | Freq: Four times a day (QID) | INTRAVENOUS | Status: DC | PRN
  Administered 2013-08-25: 500 mg via INTRAVENOUS
  Filled 2013-08-25: qty 5

## 2013-08-25 MED ORDER — ACETAMINOPHEN 650 MG RE SUPP: 650.0000 mg | Freq: Four times a day (QID) | RECTAL | Status: DC | PRN

## 2013-08-25 MED ORDER — SUCCINYLCHOLINE CHLORIDE 20 MG/ML IJ SOLN
INTRAMUSCULAR | Status: AC
  Filled 2013-08-25: qty 1

## 2013-08-25 MED ORDER — METHOCARBAMOL 500 MG PO TABS: 500.0000 mg | ORAL_TABLET | Freq: Four times a day (QID) | ORAL | Status: DC | PRN

## 2013-08-25 MED ORDER — DEXAMETHASONE SODIUM PHOSPHATE 10 MG/ML IJ SOLN
INTRAMUSCULAR | Status: AC
  Filled 2013-08-25: qty 1

## 2013-08-25 MED ORDER — METOCLOPRAMIDE HCL 10 MG PO TABS: 5.0000 mg | ORAL_TABLET | Freq: Three times a day (TID) | ORAL | Status: DC | PRN

## 2013-08-25 MED ORDER — TRAMADOL HCL 50 MG PO TABS: 50.0000 mg | ORAL_TABLET | Freq: Four times a day (QID) | ORAL | Status: DC | PRN

## 2013-08-25 MED ORDER — DEXAMETHASONE 6 MG PO TABS
10.0000 mg | ORAL_TABLET | Freq: Every day | ORAL | Status: AC
  Administered 2013-08-26: 10 mg via ORAL
  Filled 2013-08-25: qty 1

## 2013-08-25 MED ORDER — PROPOFOL 10 MG/ML IV BOLUS
INTRAVENOUS | Status: AC
  Filled 2013-08-25: qty 20

## 2013-08-25 MED ORDER — ACETAMINOPHEN 325 MG PO TABS: 650.0000 mg | ORAL_TABLET | Freq: Four times a day (QID) | ORAL | Status: DC | PRN

## 2013-08-25 MED ORDER — LIDOCAINE HCL (CARDIAC) 20 MG/ML IV SOLN
INTRAVENOUS | Status: DC | PRN
  Administered 2013-08-25: 100 mg via INTRAVENOUS

## 2013-08-25 MED ORDER — NEOSTIGMINE METHYLSULFATE 1 MG/ML IJ SOLN
INTRAMUSCULAR | Status: DC | PRN
  Administered 2013-08-25: 5 mg via INTRAVENOUS

## 2013-08-25 MED ORDER — PHENOL 1.4 % MT LIQD: 1.0000 | OROMUCOSAL | Status: DC | PRN

## 2013-08-25 MED ORDER — KETOROLAC TROMETHAMINE 15 MG/ML IJ SOLN
INTRAMUSCULAR | Status: AC
  Filled 2013-08-25: qty 1

## 2013-08-25 MED ORDER — KETOROLAC TROMETHAMINE 15 MG/ML IJ SOLN
7.5000 mg | Freq: Four times a day (QID) | INTRAMUSCULAR | Status: DC | PRN
  Administered 2013-08-25: 7.5 mg via INTRAVENOUS

## 2013-08-25 MED ORDER — RIVAROXABAN 10 MG PO TABS
10.0000 mg | ORAL_TABLET | Freq: Every day | ORAL | Status: DC
  Administered 2013-08-26: 10 mg via ORAL
  Filled 2013-08-25 (×2): qty 1

## 2013-08-25 MED ORDER — BUPIVACAINE HCL (PF) 0.25 % IJ SOLN
INTRAMUSCULAR | Status: AC
  Filled 2013-08-25: qty 30

## 2013-08-25 MED ORDER — SODIUM CHLORIDE 0.9 % IJ SOLN
INTRAMUSCULAR | Status: DC | PRN
  Administered 2013-08-25: 10:00:00

## 2013-08-25 MED ORDER — FENTANYL CITRATE 0.05 MG/ML IJ SOLN
INTRAMUSCULAR | Status: DC | PRN
  Administered 2013-08-25: 100 ug via INTRAVENOUS
  Administered 2013-08-25: 50 ug via INTRAVENOUS
  Administered 2013-08-25: 100 ug via INTRAVENOUS

## 2013-08-25 MED ORDER — CEFAZOLIN SODIUM-DEXTROSE 2-3 GM-% IV SOLR
2.0000 g | INTRAVENOUS | Status: AC
  Administered 2013-08-25: 2 g via INTRAVENOUS

## 2013-08-25 MED ORDER — HYDROMORPHONE HCL 2 MG PO TABS: 2.0000 mg | ORAL_TABLET | ORAL | Status: DC | PRN

## 2013-08-25 MED ORDER — PROMETHAZINE HCL 25 MG/ML IJ SOLN: 6.2500 mg | INTRAMUSCULAR | Status: DC | PRN

## 2013-08-25 MED ORDER — ONDANSETRON HCL 4 MG/2ML IJ SOLN
INTRAMUSCULAR | Status: AC
  Filled 2013-08-25: qty 2

## 2013-08-25 MED ORDER — FLEET ENEMA 7-19 GM/118ML RE ENEM: 1.0000 | ENEMA | Freq: Once | RECTAL | Status: AC | PRN

## 2013-08-25 MED ORDER — LACTATED RINGERS IV SOLN: INTRAVENOUS | Status: DC

## 2013-08-25 MED ORDER — HYDROMORPHONE HCL PF 1 MG/ML IJ SOLN
0.2500 mg | INTRAMUSCULAR | Status: DC | PRN
  Administered 2013-08-25 (×2): 0.5 mg via INTRAVENOUS

## 2013-08-25 MED ORDER — SUCCINYLCHOLINE CHLORIDE 20 MG/ML IJ SOLN
INTRAMUSCULAR | Status: DC | PRN
  Administered 2013-08-25: 100 mg via INTRAVENOUS

## 2013-08-25 MED ORDER — ROCURONIUM BROMIDE 100 MG/10ML IV SOLN
INTRAVENOUS | Status: AC
  Filled 2013-08-25: qty 1

## 2013-08-25 MED ORDER — DIPHENHYDRAMINE HCL 12.5 MG/5ML PO ELIX: 12.5000 mg | ORAL_SOLUTION | ORAL | Status: DC | PRN

## 2013-08-25 MED ORDER — BUPIVACAINE LIPOSOME 1.3 % IJ SUSP
20.0000 mL | Freq: Once | INTRAMUSCULAR | Status: DC
  Filled 2013-08-25: qty 20

## 2013-08-25 MED ORDER — DEXAMETHASONE SODIUM PHOSPHATE 10 MG/ML IJ SOLN
INTRAMUSCULAR | Status: DC | PRN
  Administered 2013-08-25: 10 mg via INTRAVENOUS

## 2013-08-25 MED ORDER — PROPOFOL 10 MG/ML IV BOLUS
INTRAVENOUS | Status: DC | PRN
  Administered 2013-08-25: 200 mg via INTRAVENOUS

## 2013-08-25 MED ORDER — ONDANSETRON HCL 4 MG PO TABS: 4.0000 mg | ORAL_TABLET | Freq: Four times a day (QID) | ORAL | Status: DC | PRN

## 2013-08-25 SURGICAL SUPPLY — 41 items
BAG SPEC THK2 15X12 ZIP CLS (MISCELLANEOUS) ×2
BAG ZIPLOCK 12X15 (MISCELLANEOUS) ×4 IMPLANT
BLADE SAW SGTL 18X1.27X75 (BLADE) ×2 IMPLANT
CAPT HIP PF COP ×1 IMPLANT
DECANTER SPIKE VIAL GLASS SM (MISCELLANEOUS) ×2 IMPLANT
DRAPE C-ARM 42X120 X-RAY (DRAPES) ×2 IMPLANT
DRAPE STERI IOBAN 125X83 (DRAPES) ×2 IMPLANT
DRAPE U-SHAPE 47X51 STRL (DRAPES) ×6 IMPLANT
DRSG ADAPTIC 3X8 NADH LF (GAUZE/BANDAGES/DRESSINGS) ×2 IMPLANT
DRSG MEPILEX BORDER 4X4 (GAUZE/BANDAGES/DRESSINGS) ×2 IMPLANT
DRSG MEPILEX BORDER 4X8 (GAUZE/BANDAGES/DRESSINGS) ×2 IMPLANT
DURAPREP 26ML APPLICATOR (WOUND CARE) ×2 IMPLANT
ELECT BLADE 6.5 EXT (BLADE) ×2 IMPLANT
ELECT REM PT RETURN 9FT ADLT (ELECTROSURGICAL) ×2
ELECTRODE REM PT RTRN 9FT ADLT (ELECTROSURGICAL) ×1 IMPLANT
EVACUATOR 1/8 PVC DRAIN (DRAIN) ×2 IMPLANT
FACESHIELD LNG OPTICON STERILE (SAFETY) ×8 IMPLANT
GLOVE BIO SURGEON STRL SZ7.5 (GLOVE) ×2 IMPLANT
GLOVE BIO SURGEON STRL SZ8 (GLOVE) ×4 IMPLANT
GLOVE BIOGEL PI IND STRL 8 (GLOVE) ×2 IMPLANT
GLOVE BIOGEL PI INDICATOR 8 (GLOVE) ×2
GOWN PREVENTION PLUS LG XLONG (DISPOSABLE) ×2 IMPLANT
GOWN PREVENTION PLUS XXLARGE (GOWN DISPOSABLE) ×1 IMPLANT
GOWN STRL REIN XL XLG (GOWN DISPOSABLE) ×2 IMPLANT
KIT BASIN OR (CUSTOM PROCEDURE TRAY) ×2 IMPLANT
NDL SAFETY ECLIPSE 18X1.5 (NEEDLE) ×2 IMPLANT
NEEDLE HYPO 18GX1.5 SHARP (NEEDLE) ×4
PACK TOTAL JOINT (CUSTOM PROCEDURE TRAY) ×2 IMPLANT
PADDING CAST COTTON 6X4 STRL (CAST SUPPLIES) ×2 IMPLANT
SPONGE GAUZE 4X4 12PLY (GAUZE/BANDAGES/DRESSINGS) IMPLANT
STRIP CLOSURE SKIN 1/2X4 (GAUZE/BANDAGES/DRESSINGS) ×2 IMPLANT
SUCTION FRAZIER 12FR DISP (SUCTIONS) IMPLANT
SUT ETHIBOND NAB CT1 #1 30IN (SUTURE) ×2 IMPLANT
SUT MNCRL AB 4-0 PS2 18 (SUTURE) ×2 IMPLANT
SUT VIC AB 2-0 CT1 27 (SUTURE) ×4
SUT VIC AB 2-0 CT1 TAPERPNT 27 (SUTURE) ×2 IMPLANT
SUT VLOC 180 0 24IN GS25 (SUTURE) ×2 IMPLANT
SYR 20CC LL (SYRINGE) ×2 IMPLANT
SYR 50ML LL SCALE MARK (SYRINGE) ×2 IMPLANT
TOWEL OR 17X26 10 PK STRL BLUE (TOWEL DISPOSABLE) ×2 IMPLANT
TRAY FOLEY CATH 14FRSI W/METER (CATHETERS) ×2 IMPLANT

## 2013-08-25 NOTE — Progress Notes (Signed)
Utilization review completed.  

## 2013-08-25 NOTE — Preoperative (Signed)
Beta Blockers   Reason not to administer Beta Blockers:Not Applicable 

## 2013-08-25 NOTE — Transfer of Care (Signed)
Immediate Anesthesia Transfer of Care Note  Patient: Benjamin Ballard  Procedure(s) Performed: Procedure(s): LEFT TOTAL HIP ARTHROPLASTY ANTERIOR APPROACH (Left)  Patient Location: PACU  Anesthesia Type:General  Level of Consciousness: sedated  Airway & Oxygen Therapy: Patient Spontanous Breathing and Patient connected to face mask oxygen  Post-op Assessment: Report given to PACU RN and Post -op Vital signs reviewed and stable  Post vital signs: Reviewed and stable  Complications: No apparent anesthesia complications

## 2013-08-25 NOTE — Plan of Care (Signed)
Problem: Phase I Progression Outcomes Goal: CMS/Neurovascular status WDL Outcome: Progressing Neurovascular checks wnl.  Goal: Pain controlled with appropriate interventions Outcome: Progressing Pt reporting no pain. Pain being well controlled.  Goal: Dangle or out of bed evening of surgery Outcome: Progressing Pt ambulated to bathroom DOS.  Goal: Hemodynamically stable Outcome: Progressing Pt hemodynamically stable.

## 2013-08-25 NOTE — Anesthesia Postprocedure Evaluation (Signed)
Anesthesia Post Note  Patient: Benjamin Ballard  Procedure(s) Performed: Procedure(s) (LRB): LEFT TOTAL HIP ARTHROPLASTY ANTERIOR APPROACH (Left)  Anesthesia type: General  Patient location: PACU  Post pain: Pain level controlled  Post assessment: Post-op Vital signs reviewed  Last Vitals:  Filed Vitals:   08/25/13 1339  BP: 128/80  Pulse: 88  Temp: 36.8 C  Resp:     Post vital signs: Reviewed  Level of consciousness: sedated  Complications: No apparent anesthesia complications

## 2013-08-25 NOTE — Interval H&P Note (Signed)
History and Physical Interval Note:  08/25/2013 8:16 AM  Annye Rusk  has presented today for surgery, with the diagnosis of Osteoarthritis of the Left Hip  The various methods of treatment have been discussed with the patient and family. After consideration of risks, benefits and other options for treatment, the patient has consented to  Procedure(s): LEFT TOTAL HIP ARTHROPLASTY ANTERIOR APPROACH (Left) as a surgical intervention .  The patient's history has been reviewed, patient examined, no change in status, stable for surgery.  I have reviewed the patient's chart and labs.  Questions were answered to the patient's satisfaction.     Loanne Drilling

## 2013-08-25 NOTE — H&P (View-Only) (Signed)
Benjamin Ballard  DOB: Apr 30, 1956 Married / Language: [place] / Race: White Male  Date of Admission:  08-25-2013  Chief Complaint:  Left Hip Pain  History of Present Illness The patient is a 57 year old male who comes in for a preoperative History and Physical. The patient is scheduled for a left total hip arthroplasty (anterior approach) to be performed by Dr. Gus Rankin. Aluisio, MD at [facility] on 08/25/2013.  The patient is a 57 year old male who presents with a hip problem. The patient reports left hip problems including pain symptoms that have been present for several month(s). The symptoms began without any known injury. Symptoms reported include hip pain, catching and difficulty ambulating The patient does not report any radiation of symptoms. Onset of symptoms was gradual.The patient feels as if their symptoms are does feel they are worsening. Symptoms are exacerbated by lying on the affected side. Current treatment includes nonsteroidal anti-inflammatory drugs (occasionally).  The patient previously had right hip resurfacing by Dr. Elesa Massed when he was still at [facility]. The patient states he did very well with that. He recovered quickly. He is having similar symptoms now in his left hip. He is having pain with most activities. He even has pain at rest. It is starting to limit what he can and cannot do. He is at a stage where he is wanting to pursue treatment for his left hip. He is ready to proceed with hip repalcement. They have been treated conservatively in the past for the above stated problem and despite conservative measures, they continue to have progressive pain and severe functional limitations and dysfunction. They have failed non-operative management including home exercise, medications, and injections. It is felt that they would benefit from undergoing total joint replacement. Risks and benefits of the procedure have been discussed with the patient and they  elect to proceed with surgery. There are no active contraindications to surgery such as ongoing infection or rapidly progressive neurological disease.    Problem List/Past Medical Osteoarthritis, Hip (715.35) Hip pain (719.45)    Allergies OxyCODONE HCl *ANALGESICS - OPIOID*. Difficulty breathing, Rash. Penicillins. childhood reaction    Family History Father. Deceased. Colon Cancer Mother. Living. Heart Attack, Cardiac Stents    Social History Number of flights of stairs before winded. 2-3 Pain Contract. no Tobacco use. Never smoker. never smoker Marital status. married Children. 1 Current work status. working full time Alcohol use. never consumed alcohol Drug/Alcohol Rehab (Currently). no Illicit drug use. no Living situation. live with spouse Drug/Alcohol Rehab (Previously). no Exercise. Exercises weekly; does other    Medication History Multivitamins ( Oral) Active. Goodys Extra Strength ( Oral) Specific dose unknown - Active. Vitamin C ( Oral) Active.    Past Surgical History Right hip resurfacing by Dr. Elesa Massed in July 2012   Review of Systems General:Not Present- Chills, Fever, Night Sweats, Fatigue, Weight Gain, Weight Loss and Memory Loss. Skin:Not Present- Hives, Itching, Rash, Eczema and Lesions. HEENT:Not Present- Tinnitus, Headache, Double Vision, Visual Loss, Hearing Loss and Dentures. Respiratory:Not Present- Shortness of breath with exertion, Shortness of breath at rest, Allergies, Coughing up blood and Chronic Cough. Cardiovascular:Not Present- Chest Pain, Racing/skipping heartbeats, Difficulty Breathing Lying Down, Murmur, Swelling and Palpitations. Gastrointestinal:Not Present- Bloody Stool, Heartburn, Abdominal Pain, Vomiting, Nausea, Constipation, Diarrhea, Difficulty Swallowing, Jaundice and Loss of appetitie. Male Genitourinary:Not Present- Urinary frequency, Blood in Urine, Weak urinary stream, Discharge,  Flank Pain, Incontinence, Painful Urination, Urgency, Urinary Retention and Urinating at Night. Musculoskeletal:Present- Joint Pain. Not  Present- Muscle Weakness, Muscle Pain, Joint Swelling, Back Pain, Morning Stiffness and Spasms. Neurological:Not Present- Tremor, Dizziness, Blackout spells, Paralysis, Difficulty with balance and Weakness. Psychiatric:Not Present- Insomnia.    Vitals Weight: 188 lb Height: 73 in Weight was reported by patient. Height was reported by patient. Body Surface Area: 2.1 m Body Mass Index: 24.8 kg/m Pulse: 88 (Regular) Resp.: 14 (Unlabored) BP: 124/78 (Sitting, Left Arm, Standard)   Physical Exam The physical exam findings are as follows:  Note: Patient is a 57 year old male with continued hip pain.   General Mental Status - Alert, cooperative and good historian. General Appearance- pleasant. Not in acute distress. Orientation- Oriented X3. Build & Nutrition- Well nourished and Well developed.   Head and Neck Head- normocephalic, atraumatic . Neck Global Assessment- supple. no bruit auscultated on the right and no bruit auscultated on the left.   Eye Vision- Wears corrective lenses. Pupil- Bilateral- Regular and Round. Motion- Bilateral- EOMI.   Chest and Lung Exam Auscultation: Breath sounds:- clear at anterior chest wall and - clear at posterior chest wall. Adventitious sounds:- No Adventitious sounds.   Cardiovascular Auscultation:Rhythm- Regular rate and rhythm (with an occassional "ectopic" beat heard on exam). Heart Sounds- S1 WNL and S2 WNL. Murmurs & Other Heart Sounds:Auscultation of the heart reveals - No Murmurs.   Abdomen Palpation/Percussion:Tenderness- Abdomen is non-tender to palpation. Rigidity (guarding)- Abdomen is soft. Auscultation:Auscultation of the abdomen reveals - Bowel sounds normal.   Male Genitourinary Not done, not pertinent to present  illness  Musculoskeletal On exam well developed male, alert, and oriented in no apparent distress. Evaluation of his right hip flexion about 100, rotation in 20, out 30, and abduction 30 without discomfort. The left hip flexion 90, no internal rotation, about 5 degrees external rotation, about 5-10 degrees of abduction. His gait pattern is antalgic on the left. Pulse sensation and motor intact both lower extremities.  RADIOGRAPHS: AP pelvis and lateral of the left hip show the resurfacing in good position on the right. On the left he has bone on bone arthritis throughout the hip. There is flattening of the femoral head and osteophyte formation.   Assessment & Plan Osteoarthritis, Hip (715.35) Impression: Left Hip  Note: Plan is for a LeftTotal Hip Replacement - Anterior Approach by Dr. Lequita Halt.  Plan is to go home.  PCP - Dr. Bobbye Charleston - Patient has been seen preoperatively and felt to be stable for surgery.  The patient does not have any contraindications and will receive TXA (tranexamic acid) prior to surgery.  Signed electronically by Lauraine Rinne, III PA-C

## 2013-08-25 NOTE — Evaluation (Signed)
Physical Therapy Evaluation Patient Details Name: Benjamin Ballard MRN: [id number] DOB: October 27, 1955 Today's Date: 08/25/2013 Time: 3086-5784 PT Time Calculation (min): 28 min  PT Assessment / Plan / Recommendation History of Present Illness     Clinical Impression  Pt s/p L THR presents with decreased L LE strength/ROM and post op pain limiting functional mobility.  Pt should progress well to d/c home with family assist.      PT Assessment  Patient needs continued PT services    Follow Up Recommendations  No PT follow up (Pt states wants No HHPT follow up)    Does the patient have the potential to tolerate intense rehabilitation      Barriers to Discharge        Equipment Recommendations  None recommended by PT    Recommendations for Other Services OT consult   Frequency 7X/week    Precautions / Restrictions Precautions Precautions: Fall Restrictions Weight Bearing Restrictions: No Other Position/Activity Restrictions: WBAT   Pertinent Vitals/Pain 2/10; premed, ice pack provided      Mobility  Bed Mobility Bed Mobility: Supine to Sit Supine to Sit: 4: Min assist Details for Bed Mobility Assistance: cues for sequence and use of R LE to self assist Transfers Transfers: Sit to Stand;Stand to Sit Sit to Stand: 4: Min assist Stand to Sit: 4: Min assist Details for Transfer Assistance: cues for LE management and use of UEs to self assist Ambulation/Gait Ambulation/Gait Assistance: 4: Min assist Ambulation Distance (Feet): 200 Feet Assistive device: Rolling walker Ambulation/Gait Assistance Details: cues for initial sequence, posture and position from RW Gait Pattern: Step-to pattern;Step-through pattern;Decreased step length - right;Decreased step length - left;Shuffle;Antalgic;Trunk flexed Stairs: No    Exercises Total Joint Exercises Ankle Circles/Pumps: AROM;Both;15 reps;Supine Quad Sets: AROM;Both;10 reps;Supine Heel Slides: AAROM;15 reps;Supine;Left Hip  ABduction/ADduction: AAROM;Left;10 reps;Supine   PT Diagnosis: Difficulty walking  PT Problem List: Decreased strength;Decreased range of motion;Decreased activity tolerance;Decreased mobility;Decreased knowledge of use of DME;Pain PT Treatment Interventions: DME instruction;Gait training;Functional mobility training;Therapeutic activities;Therapeutic exercise;Patient/family education     PT Goals(Current goals can be found in the care plan section) Acute Rehab PT Goals Patient Stated Goal: Resume previous lifestyle with decreased pain PT Goal Formulation: With patient Time For Goal Achievement: 09/01/13 Potential to Achieve Goals: Good  Visit Information  Last PT Received On: 08/25/13 Assistance Needed: +1       Prior Functioning  Home Living Family/patient expects to be discharged to:: Private residence Living Arrangements: Spouse/significant other Available Help at Discharge: Family Type of Home: House Home Access: Level entry Home Layout: One level Home Equipment: Environmental consultant - 2 wheels Prior Function Level of Independence: Independent Communication Communication: No difficulties Dominant Hand: Right    Cognition  Cognition Arousal/Alertness: Awake/alert Behavior During Therapy: WFL for tasks assessed/performed Overall Cognitive Status: Within Functional Limits for tasks assessed    Extremity/Trunk Assessment Upper Extremity Assessment Upper Extremity Assessment: Overall WFL for tasks assessed Lower Extremity Assessment Lower Extremity Assessment: LLE deficits/detail LLE Deficits / Details: 3-/5 hip strength with AAROM at hip to 80 flex and 15 abd   Balance    End of Session PT - End of Session Equipment Utilized During Treatment: Gait belt Activity Tolerance: Patient tolerated treatment well Patient left: in chair;with call bell/phone within reach;with family/visitor present Nurse Communication: Mobility status  GP     Benjamin Ballard 08/25/2013, 5:09 PM

## 2013-08-25 NOTE — Op Note (Signed)
OPERATIVE REPORT  PREOPERATIVE DIAGNOSIS: Osteoarthritis of the Left hip.   POSTOPERATIVE DIAGNOSIS: Osteoarthritis of the Left  hip.   PROCEDURE: Left total hip arthroplasty, anterior approach.   SURGEON: Ollen Gross, MD   ASSISTANT: Avel Peace, PA-C  ANESTHESIA:  General  ESTIMATED BLOOD LOSS:- 200 ml  DRAINS: Hemovac x1.   COMPLICATIONS: None   CONDITION: PACU - hemodynamically stable.   BRIEF CLINICAL NOTE: Benjamin Ballard is a 57 y.o. male who has advanced end-  stage arthritis of his Left  hip with progressively worsening pain and  dysfunction.The patient has failed nonoperative management and presents for  total hip arthroplasty.   PROCEDURE IN DETAIL: After successful administration of spinal  anesthetic, the traction boots for the [facility] bed were placed on both  feet and the patient was placed onto the [facility] bed, boots placed into the leg  holders. The Left hip was then isolated from the perineum with plastic  drapes and prepped and draped in the usual sterile fashion. ASIS and  greater trochanter were marked and a oblique incision was made, starting  at about 1 cm lateral and 2 cm distal to the ASIS and coursing towards  the anterior cortex of the femur. The skin was cut with a 10 blade  through subcutaneous tissue to the level of the fascia overlying the  tensor fascia lata muscle. The fascia was then incised in line with the  incision at the junction of the anterior third and posterior 2/3rd. The  muscle was teased off the fascia and then the interval between the TFL  and the rectus was developed. The Hohmann retractor was then placed at  the top of the femoral neck over the capsule. The vessels overlying the  capsule were cauterized and the fat on top of the capsule was removed.  A Hohmann retractor was then placed anterior underneath the rectus  femoris to give exposure to the entire anterior capsule. A T-shaped  capsulotomy was performed. The  edges were tagged and the femoral head  was identified.       Osteophytes are removed off the superior acetabulum.  The femoral neck was then cut in situ with an oscillating saw. Traction  was then applied to the left lower extremity utilizing the [facility]  traction. The femoral head was then removed. Retractors were placed  around the acetabulum and then circumferential removal of the labrum was  performed. Osteophytes were also removed. Reaming starts at 47 mm to  medialize and  Increased in 2 mm increments to 53 mm. We reamed in  approximately 40 degrees of abduction, 20 degrees anteversion. A 54 mm  pinnacle acetabular shell was then impacted in anatomic position under  fluoroscopic guidance with excellent purchase. We did not need to place  any additional dome screws. A 36 mm neutral + 4 marathon liner was then  placed into the acetabular shell.       The femoral lift was then placed along the lateral aspect of the femur  just distal to the vastus ridge. The leg was  externally rotated and capsule  was stripped off the inferior aspect of the femoral neck down to the  level of the lesser trochanter, this was done with electrocautery. The femur was lifted after this was performed. The  leg was then placed and extended in adducted position to essentially delivering the femur. We also removed the capsule superiorly and the  piriformis from the piriformis  fossa to gain excellent exposure of the  proximal femur. Rongeur was used to remove some cancellous bone to get  into the lateral portion of the proximal femur for placement of the  initial starter reamer. The starter broaches was placed  the starter broach  and was shown to go down the center of the canal. Broaching  with the  Corail system was then performed starting at size 8, coursing  Up to size 12. A size 12 had excellent torsional and rotational  and axial stability. The trial high offset neck was then placed  with a 36 + 1.5l head. The  hip was then reduced. We confirmed that  the stem was in the canal both on AP and lateral x-rays. It also has excellent sizing. The hip was reduced with outstanding stability through full extension, full external rotation,  and then flexion in adduction internal rotation. AP pelvis was taken  and the leg lengths were measured and found to be exactly equal. Hip  was then dislocated again and the femoral head and neck removed. The  femoral broach was removed. Size 12orail stem with a high offset neck was then impacted into the femur following native anteversion. Has  excellent purchase in the canal. Excellent torsional and rotational and  axial stability. It is confirmed to be in the canal on AP and lateral  fluoroscopic views. The 36 + 1.5  ceramic head is placed and the hip reduced with outstanding stability. Again AP pelvis was taken and it  confirmed that the leg lengths were equal. The wound was then copiously  irrigated with saline solution and the capsule reattached and repaired  with Ethibond suture.  20 mL of Exparel mixed with 50 mL of saline then additional 20 ml of .25% Bupivicaine injected into the capsule and into the edge of the tensor fascia lata as well as subcutaneous tissue. The fascia overlying the tensor fascia lata was  then closed with a running #1 V-Loc. Subcu was closed with interrupted  2-0 Vicryl and subcuticular running 4-0 Monocryl. Incision was cleaned  and dried. Steri-Strips and a bulky sterile dressing applied. Hemovac  drain was hooked to suction and then he was awakened and transported to  recovery in stable condition.        Please note that a surgical assistant was a medical necessity for this procedure to perform it in a safe and expeditious manner. Assistant was necessary to provide appropriate retraction of vital neurovascular structures and to prevent femoral fracture and allow for anatomic placement of the prosthesis.  Ollen Gross, M.D.

## 2013-08-26 DIAGNOSIS — D62 Acute posthemorrhagic anemia: Secondary | ICD-10-CM | POA: Diagnosis not present

## 2013-08-26 DIAGNOSIS — E871 Hypo-osmolality and hyponatremia: Secondary | ICD-10-CM | POA: Diagnosis not present

## 2013-08-26 LAB — BASIC METABOLIC PANEL
BUN: 8 mg/dL (ref 6–23)
Calcium: 8.6 mg/dL (ref 8.4–10.5)
Chloride: 98 mEq/L (ref 96–112)
Creatinine, Ser: 1.02 mg/dL (ref 0.50–1.35)
GFR calc Af Amer: >90 mL/min (ref >90)
Glucose, Bld: 158 mg/dL — ABNORMAL HIGH (ref 70–99)
Potassium: 4 mEq/L (ref 3.5–5.1)

## 2013-08-26 LAB — CBC
HCT: 39.6 % (ref 39.0–52.0)
Hemoglobin: 12.9 g/dL — ABNORMAL LOW (ref 13.0–17.0)
MCV: 96.4 fL (ref 78.0–100.0)
RDW: 13.9 % (ref 11.5–15.5)
WBC: 11.1 10*3/uL — ABNORMAL HIGH (ref 4.0–10.5)

## 2013-08-26 MED ORDER — TRAMADOL HCL 50 MG PO TABS: 50.0000 mg | ORAL_TABLET | Freq: Four times a day (QID) | ORAL | Status: DC | PRN

## 2013-08-26 MED ORDER — HYDROMORPHONE HCL 2 MG PO TABS: 2.0000 mg | ORAL_TABLET | ORAL | Status: DC | PRN

## 2013-08-26 MED ORDER — RIVAROXABAN 10 MG PO TABS: 10.0000 mg | ORAL_TABLET | Freq: Every day | ORAL | Status: DC

## 2013-08-26 MED ORDER — METHOCARBAMOL 500 MG PO TABS: 500.0000 mg | ORAL_TABLET | Freq: Four times a day (QID) | ORAL | Status: DC | PRN

## 2013-08-26 NOTE — Progress Notes (Signed)
OT Cancellation Note  Patient Details Name: Benjamin Ballard MRN: [id number] DOB: 1956-05-31   Cancelled Treatment:    Reason Eval/Treat Not Completed: OT screened, no needs identified, will sign off  Lenka Zhao, [place] Kung 08/26/2013, 8:13 AM

## 2013-08-26 NOTE — Discharge Summary (Signed)
Physician Discharge Summary   Patient ID: Benjamin Ballard MRN: [id number] DOB/AGE: [birth year] 57 y.o.  Admit date: 08/25/2013 Discharge date: 08/26/2013  Primary Diagnosis:  Osteoarthritis of the Left hip.   Admission Diagnoses:  Past Medical History  Diagnosis Date  . Arthritis     oa  . GERD (gastroesophageal reflux disease)     no recent reflux meds   Discharge Diagnoses:   Principal Problem:   OA (osteoarthritis) of hip Active Problems:   Postoperative anemia due to acute blood loss   Hyponatremia  Estimated body mass index is 24.55 kg/(m^2) as calculated from the following:   Height as of this encounter: 6\' 1"  (1.854 m).   Weight as of this encounter: 84.369 kg (186 lb).  Procedure(s) (LRB): LEFT TOTAL HIP ARTHROPLASTY ANTERIOR APPROACH (Left)   Consults: None  HPI: Benjamin Ballard is a 57 y.o. male who has advanced end-  stage arthritis of his Left hip with progressively worsening pain and  dysfunction.The patient has failed nonoperative management and presents for  total hip arthroplasty.   Laboratory Data: Admission on 08/25/2013  Component Date Value Range Status  . ABO/RH(D) 08/25/2013 O POS   Final  . Antibody Screen 08/25/2013 NEG   Final  . Sample Expiration 08/25/2013 08/28/2013   Final  . Unit Number 08/25/2013 [id number]   Final  . Blood Component Type 08/25/2013 RED CELLS,LR   Final  . Unit division 08/25/2013 00   Final  . Status of Unit 08/25/2013 REL FROM [facility]   Final  . Transfusion Status 08/25/2013 OK TO TRANSFUSE   Final  . Crossmatch Result 08/25/2013 Compatible   Final  . ABO/RH(D) 08/25/2013 O POS   Final  . WBC 08/26/2013 11.1* 4.0 - 10.5 K/uL Final  . RBC 08/26/2013 4.11* 4.22 - 5.81 MIL/uL Final  . Hemoglobin 08/26/2013 12.9* 13.0 - 17.0 g/dL Final  . HCT 78/46/9629 39.6  39.0 - 52.0 % Final  . MCV 08/26/2013 96.4  78.0 - 100.0 fL Final  . MCH 08/26/2013 31.4  26.0 - 34.0 pg Final  . MCHC 08/26/2013 32.6  30.0 - 36.0 g/dL Final    . RDW 52/84/1324 13.9  11.5 - 15.5 % Final  . Platelets 08/26/2013 247  150 - 400 K/uL Final  . Sodium 08/26/2013 132* 135 - 145 mEq/L Final  . Potassium 08/26/2013 4.0  3.5 - 5.1 mEq/L Final  . Chloride 08/26/2013 98  96 - 112 mEq/L Final  . CO2 08/26/2013 23  19 - 32 mEq/L Final  . Glucose, Bld 08/26/2013 158* 70 - 99 mg/dL Final  . BUN 40/06/2724 8  6 - 23 mg/dL Final  . Creatinine, Ser 08/26/2013 1.02  0.50 - 1.35 mg/dL Final  . Calcium 36/64/4034 8.6  8.4 - 10.5 mg/dL Final  . GFR calc non Af Amer 08/26/2013 80* >90 mL/min Final  . GFR calc Af Amer 08/26/2013 >90  >90 mL/min Final   Comment: (NOTE)                          The eGFR has been calculated using the CKD EPI equation.                          This calculation has not been validated in all clinical situations.  eGFR's persistently <90 mL/min signify possible Chronic Kidney                          Disease.  Hospital Outpatient Visit on 08/18/2013  Component Date Value Range Status  . aPTT 08/18/2013 31  24 - 37 seconds Final  . WBC 08/18/2013 6.5  4.0 - 10.5 K/uL Final  . RBC 08/18/2013 4.96  4.22 - 5.81 MIL/uL Final  . Hemoglobin 08/18/2013 15.8  13.0 - 17.0 g/dL Final  . HCT 16/06/9603 47.2  39.0 - 52.0 % Final  . MCV 08/18/2013 95.2  78.0 - 100.0 fL Final  . MCH 08/18/2013 31.9  26.0 - 34.0 pg Final  . MCHC 08/18/2013 33.5  30.0 - 36.0 g/dL Final  . RDW 54/05/8118 13.7  11.5 - 15.5 % Final  . Platelets 08/18/2013 250  150 - 400 K/uL Final  . Sodium 08/18/2013 138  135 - 145 mEq/L Final  . Potassium 08/18/2013 4.1  3.5 - 5.1 mEq/L Final  . Chloride 08/18/2013 102  96 - 112 mEq/L Final  . CO2 08/18/2013 27  19 - 32 mEq/L Final  . Glucose, Bld 08/18/2013 81  70 - 99 mg/dL Final  . BUN 14/78/2956 9  6 - 23 mg/dL Final  . Creatinine, Ser 08/18/2013 0.97  0.50 - 1.35 mg/dL Final  . Calcium 21/30/8657 9.7  8.4 - 10.5 mg/dL Final  . Total Protein 08/18/2013 7.6  6.0 - 8.3 g/dL Final  . Albumin  84/69/6295 4.3  3.5 - 5.2 g/dL Final  . AST 28/41/3244 22  0 - 37 U/L Final  . ALT 08/18/2013 24  0 - 53 U/L Final  . Alkaline Phosphatase 08/18/2013 92  39 - 117 U/L Final  . Total Bilirubin 08/18/2013 0.4  0.3 - 1.2 mg/dL Final  . GFR calc non Af Amer 08/18/2013 >90  >90 mL/min Final  . GFR calc Af Amer 08/18/2013 >90  >90 mL/min Final   Comment: (NOTE)                          The eGFR has been calculated using the CKD EPI equation.                          This calculation has not been validated in all clinical situations.                          eGFR's persistently <90 mL/min signify possible Chronic Kidney                          Disease.  Marland Kitchen Prothrombin Time 08/18/2013 12.9  11.6 - 15.2 seconds Final  . INR 08/18/2013 0.99  0.00 - 1.49 Final  . Color, Urine 08/18/2013 YELLOW  YELLOW Final  . APPearance 08/18/2013 CLEAR  CLEAR Final  . Specific Gravity, Urine 08/18/2013 1.007  1.005 - 1.030 Final  . pH 08/18/2013 6.0  5.0 - 8.0 Final  . Glucose, UA 08/18/2013 NEGATIVE  NEGATIVE mg/dL Final  . Hgb urine dipstick 08/18/2013 NEGATIVE  NEGATIVE Final  . Bilirubin Urine 08/18/2013 NEGATIVE  NEGATIVE Final  . Ketones, ur 08/18/2013 NEGATIVE  NEGATIVE mg/dL Final  . Protein, ur 09/11/7251 NEGATIVE  NEGATIVE mg/dL Final  . Urobilinogen, UA 08/18/2013 0.2  0.0 - 1.0 mg/dL Final  . Nitrite 66/44/0347  NEGATIVE  NEGATIVE Final  . Leukocytes, UA 08/18/2013 NEGATIVE  NEGATIVE Final   MICROSCOPIC NOT DONE ON URINES WITH NEGATIVE PROTEIN, BLOOD, LEUKOCYTES, NITRITE, OR GLUCOSE <1000 mg/dL.  Marland Kitchen MRSA, PCR 08/18/2013 NEGATIVE  NEGATIVE Final  . Staphylococcus aureus 08/18/2013 POSITIVE* NEGATIVE Final   Comment:                                 The Xpert SA Assay (FDA                          approved for NASAL specimens                          in patients over 68 years of age),                          is one component of                          a comprehensive surveillance                           program.  Test performance has                          been validated by [employer] for patients greater                          than or equal to 63 year old.                          It is not intended                          to diagnose infection nor to                          guide or monitor treatment.     X-Rays:Dg Hip Complete Left  08/18/2013   CLINICAL DATA:  Preoperative evaluation for left hip surgery, osteoarthritis  EXAM: LEFT HIP - COMPLETE 2+ VIEW  COMPARISON:  None  FINDINGS: Osseous demineralization.  Prior right hip replacement.  Advanced osteoarthritic changes of the left hip with joint space narrowing and spur formation as well as subchondral cystic change particularly superiorly.  Bone-on-bone appearance superiorly.  No acute fracture, dislocation, or bone destruction.  Small left pelvic phleboliths noted.  IMPRESSION: Advanced osteoarthritic changes of the left hip.   Electronically Signed   By: Ulyses Southward M.D.   On: 08/18/2013 16:14   Dg Pelvis Portable  08/25/2013   CLINICAL DATA:  Postop left hip replacement  EXAM: PORTABLE PELVIS 1-2 VIEWS  COMPARISON:  Preoperative radiographs 08/18/2013  FINDINGS: Interval surgical changes of left total hip arthroplasty. A surgical drain projects over the left greater trochanter. The femoral head component appears located with respect to the acetabular component. No evidence of immediate hardware complication. Surgical changes of remote right hip [place] resurfacing procedure are also evident.  There is stable lucency about the acetabular component. Bony mineralization is otherwise within normal limits.  IMPRESSION: Surgical changes of interval left hip arthroplasty without evidence of immediate hardware complication.   Electronically Signed   By: Malachy Moan M.D.   On: 08/25/2013 10:55   Dg C-arm 1-60 Min-no Report  08/25/2013   CLINICAL DATA: ANTERIOR HIP LEFT   C-ARM 1-60 MINUTES   Fluoroscopy was utilized by the requesting physician.  No radiographic  interpretation.     EKG: Orders placed in visit on 06/24/13  . EKG 12-LEAD     Hospital Course: Patient was admitted to [facility] and taken to the OR and underwent the above state procedure without complications.  Patient tolerated the procedure well and was later transferred to the recovery room and then to the orthopaedic floor for postoperative care.  They were given PO and IV analgesics for pain control following their surgery.  They were given 24 hours of postoperative antibiotics of  Anti-infectives   Start     Dose/Rate Route Frequency Ordered Stop   08/25/13 1400  ceFAZolin (ANCEF) IVPB 2 g/50 mL premix     2 g 100 mL/hr over 30 Minutes Intravenous Every 6 hours 08/25/13 1223 08/25/13 2014   08/25/13 0633  ceFAZolin (ANCEF) IVPB 2 g/50 mL premix     2 g 100 mL/hr over 30 Minutes Intravenous On call to O.R. 08/25/13 1610 08/25/13 0823     and started on DVT prophylaxis in the form of Xarelto.   PT and OT were ordered for total hip protocol.  The patient was allowed to be WBAT with therapy. Discharge planning was consulted to help with postop disposition and equipment needs.  Patient had a great night on the evening of surgery.  They started to get up OOB with therapy on day one.  Hemovac drain was pulled without difficulty.  Patient was seen in rounds on day one by Dr. Lequita Halt.  The patient was doing well ans wanted to go home.  He worked with therapy and was ready to go home.  Discharge home - Does not need home health  Diet - Regular diet  Follow up - in 2 weeks  Activity - WBAT  Disposition - Home  Condition Upon Discharge - Good  D/C Meds - See DC Summary  DVT Prophylaxis - Xarelto       Discharge Orders   Future Appointments Provider Department Dept Phone   08/31/2013 3:30 PM Monica Becton, MD [facility] [place] [phone]   Future Orders Complete By  Expires   Call MD / Call 911  As directed    Comments:     If you experience chest pain or shortness of breath, CALL 911 and be transported to the hospital emergency room.  If you develope a fever above 101 F, pus (white drainage) or increased drainage or redness at the wound, or calf pain, call your surgeon's office.   Change dressing  As directed    Comments:     You may change your dressing dressing daily with sterile 4 x 4 inch gauze dressing and paper tape.  Do not submerge the incision under water.   Constipation Prevention  As directed    Comments:     Drink plenty of fluids.  Prune juice may be helpful.  You may use a stool softener, such as Colace (over the counter) 100 mg twice a day.  Use MiraLax (over the counter) for  constipation as needed.   Diet general  As directed    Discharge instructions  As directed    Comments:     Pick up stool softner and laxative for home. Do not submerge incision under water. May shower. Continue to use ice for pain and swelling from surgery.  Take Xarelto for two and a half more weeks, then discontinue Xarelto. Once the patient has completed the blood thinner regimen, then take a Baby 81 mg Aspirin daily for four more weeks.   Do not sit on low chairs, stoools or toilet seats, as it may be difficult to get up from low surfaces  As directed    Driving restrictions  As directed    Comments:     No driving until released by the physician.   Increase activity slowly as tolerated  As directed    Lifting restrictions  As directed    Comments:     No lifting until released by the physician.   Patient may shower  As directed    Comments:     You may shower without a dressing once there is no drainage.  Do not wash over the wound.  If drainage remains, do not shower until drainage stops.   TED hose  As directed    Comments:     Use stockings (TED hose) for 3 weeks on both leg(s).  You may remove them at night for sleeping.   Weight bearing as  tolerated  As directed    Questions:     Laterality:     Extremity:         Medication List    STOP taking these medications       VITAMIN C PO      TAKE these medications       HYDROmorphone 2 MG tablet  Commonly known as:  DILAUDID  Take 1-2 tablets (2-4 mg total) by mouth every 4 (four) hours as needed for moderate pain or severe pain.     methocarbamol 500 MG tablet  Commonly known as:  ROBAXIN  Take 1 tablet (500 mg total) by mouth every 6 (six) hours as needed for muscle spasms.     rivaroxaban 10 MG Tabs tablet  Commonly known as:  XARELTO  - Take 1 tablet (10 mg total) by mouth daily with breakfast. Take Xarelto for three weeks, then discontinue Xarelto.  - Once the patient has completed the blood thinner regimen, then take a Baby 81 mg Aspirin daily for four more weeks.     traMADol 50 MG tablet  Commonly known as:  ULTRAM  Take 1-2 tablets (50-100 mg total) by mouth every 6 (six) hours as needed (mild pain).       Follow-up Information   Follow up with Loanne Drilling, MD. Schedule an appointment as soon as possible for a visit in 2 weeks.   Specialty:  Orthopedic Surgery   Contact information:   [address] [place] [address] [phone]       Signed: Patrica Duel 08/26/2013, 7:17 AM

## 2013-08-26 NOTE — Progress Notes (Signed)
Pt to d/c home. No DME needs. AVS reviewed and "My Chart" discussed with pt. Pt capable of verbalizing medications and follow-up appointments. Remains hemodynamically stable. No signs and symptoms of distress. Educated pt to return to ER in the case of SOB, dizziness, or chest pain.  

## 2013-08-26 NOTE — Progress Notes (Signed)
   Subjective: 1 Day Post-Op Procedure(s) (LRB): LEFT TOTAL HIP ARTHROPLASTY ANTERIOR APPROACH (Left) Patient reports pain as mild.   Patient seen in rounds by Dr. Lequita Halt. Patient is well, and has had no acute complaints or problems Patient is ready to go home later today.  Objective: Vital signs in last 24 hours: Temp:  [96.8 F (36 C)-99.1 F (37.3 C)] 98.5 F (36.9 C) (12/18 0646) Pulse Rate:  [80-95] 84 (12/18 0646) Resp:  [12-18] 16 (12/18 0646) BP: (111-152)/(58-90) 111/73 mmHg (12/18 0646) SpO2:  [95 %-100 %] 95 % (12/18 0646) Weight:  [84.369 kg (186 lb)] 84.369 kg (186 lb) (12/17 1153)  Intake/Output from previous day:  Intake/Output Summary (Last 24 hours) at 08/26/13 0711 Last data filed at 08/26/13 1610  Gross per 24 hour  Intake 2681.59 ml  Output   3543 ml  Net -861.41 ml    Labs:  Recent Labs  08/26/13 0442  HGB 12.9*    Recent Labs  08/26/13 0442  WBC 11.1*  RBC 4.11*  HCT 39.6  PLT 247    Recent Labs  08/26/13 0442  NA 132*  K 4.0  CL 98  CO2 23  BUN 8  CREATININE 1.02  GLUCOSE 158*  CALCIUM 8.6   No results found for this basename: LABPT, INR,  in the last 72 hours  EXAM: General - Patient is Alert, Appropriate and Oriented Extremity - Neurovascular intact Sensation intact distally Dressing intact Motor Function - intact, moving foot and toes well on exam.   Assessment/Plan: 1 Day Post-Op Procedure(s) (LRB): LEFT TOTAL HIP ARTHROPLASTY ANTERIOR APPROACH (Left) Procedure(s) (LRB): LEFT TOTAL HIP ARTHROPLASTY ANTERIOR APPROACH (Left) Past Medical History  Diagnosis Date  . Arthritis     oa  . GERD (gastroesophageal reflux disease)     no recent reflux meds   Principal Problem:   OA (osteoarthritis) of hip Active Problems:   Postoperative anemia due to acute blood loss   Hyponatremia  Estimated body mass index is 24.55 kg/(m^2) as calculated from the following:   Height as of this encounter: 6\' 1"  (1.854 m).  Weight as of this encounter: 84.369 kg (186 lb). Up with therapy Discharge home - Does not need home health Diet - Regular diet Follow up - in 2 weeks Activity - WBAT Disposition - Home Condition Upon Discharge - Good D/C Meds - See DC Summary DVT Prophylaxis - Xarelto  PERKINS, ALEXZANDREW 08/26/2013, 7:11 AM

## 2013-08-26 NOTE — Progress Notes (Signed)
Physical Therapy Treatment Patient Details Name: Benjamin Ballard MRN: [id number] DOB: [birth year] Today's Date: 08/26/2013 Time: 4098-1191 PT Time Calculation (min): 29 min  PT Assessment / Plan / Recommendation  History of Present Illness     PT Comments   Pt progressing well and ready for d/c from PT perspective.  HOme therex program supplied  Follow Up Recommendations  No PT follow up     Does the patient have the potential to tolerate intense rehabilitation     Barriers to Discharge        Equipment Recommendations  None recommended by PT    Recommendations for Other Services OT consult  Frequency 7X/week   Progress towards PT Goals Progress towards PT goals: Progressing toward goals  Plan Current plan remains appropriate    Precautions / Restrictions Precautions Precautions: Fall Restrictions Weight Bearing Restrictions: No Other Position/Activity Restrictions: WBAT   Pertinent Vitals/Pain Min c/o pain    Mobility  Bed Mobility Bed Mobility: Supine to Sit;Sit to Supine Supine to Sit: 4: Min guard Sit to Supine: 4: Min guard Details for Bed Mobility Assistance: cues for sequence and use of R LE to self assist Transfers Transfers: Sit to Stand;Stand to Sit Sit to Stand: 5: Supervision Stand to Sit: 5: Supervision Details for Transfer Assistance: cues for LE management and use of UEs to self assist Ambulation/Gait Ambulation/Gait Assistance: 4: Min guard;5: Supervision Ambulation Distance (Feet): 300 Feet Assistive device: Rolling walker Ambulation/Gait Assistance Details: min cues for posture and position from RW Gait Pattern: Step-through pattern Stairs: Yes Stairs Assistance: 4: Min guard Stair Management Technique: One rail Right;Forwards;Step to pattern Number of Stairs: 4    Exercises Total Joint Exercises Ankle Circles/Pumps: AROM;Both;15 reps;Supine Quad Sets: AROM;Both;10 reps;Supine Heel Slides: AAROM;Supine;Left;20 reps Hip ABduction/ADduction:  AAROM;Left;Supine;20 reps Long Arc Quad: AROM;Left;10 reps;Supine   PT Diagnosis:    PT Problem List:   PT Treatment Interventions:     PT Goals (current goals can now be found in the care plan section) Acute Rehab PT Goals Patient Stated Goal: Resume previous lifestyle with decreased pain PT Goal Formulation: With patient Time For Goal Achievement: 09/01/13 Potential to Achieve Goals: Good  Visit Information  Last PT Received On: 08/26/13 Assistance Needed: +1    Subjective Data  Subjective: 'm ready to go home Patient Stated Goal: Resume previous lifestyle with decreased pain   Cognition  Cognition Arousal/Alertness: Awake/alert Behavior During Therapy: WFL for tasks assessed/performed Overall Cognitive Status: Within Functional Limits for tasks assessed    Balance     End of Session PT - End of Session Activity Tolerance: Patient tolerated treatment well Patient left: in bed;with call bell/phone within reach;with family/visitor present Nurse Communication: Mobility status   GP     Benjamin Ballard 08/26/2013, 9:56 AM

## 2013-08-26 NOTE — Care Management Note (Signed)
    Page 1 of 1   08/26/2013     12:48:50 PM   CARE MANAGEMENT NOTE 08/26/2013  Patient:  Benjamin Ballard, Benjamin Ballard   Account Number:  [id number]  Date Initiated:  08/26/2013  Documentation initiated by:  Colleen Can  Subjective/Objective Assessment:   dx left hip arthroplasty     Action/Plan:   Plans are for patient to return to his home upon discharge. Family will asisst pt as needed. There are no HH or DME recommendation or orders.   Anticipated DC Date:  08/26/2013   Anticipated DC Plan:  HOME/SELF CARE      DC Planning Services  CM consult      Choice offered to / List presented to:             Status of service:  Completed, signed off Medicare Important Message given?   (If response is "NO", the following Medicare IM given date fields will be blank) Date Medicare IM given:   Date Additional Medicare IM given:    Discharge Disposition:  HOME/SELF CARE  Per UR Regulation:    If discussed at Long Length of Stay Meetings, dates discussed:    Comments:

## 2013-08-31 ENCOUNTER — Ambulatory Visit: Payer: BC Managed Care – PPO | Admitting: Sports Medicine

## 2013-10-25 ENCOUNTER — Encounter: Payer: Self-pay | Admitting: Sports Medicine

## 2013-10-25 ENCOUNTER — Ambulatory Visit (INDEPENDENT_AMBULATORY_CARE_PROVIDER_SITE_OTHER): Payer: BC Managed Care – PPO | Admitting: Sports Medicine

## 2013-10-25 VITALS — BP 139/76 | HR 104 | Ht 73.0 in | Wt 189.0 lb

## 2013-10-25 DIAGNOSIS — Z96649 Presence of unspecified artificial hip joint: Secondary | ICD-10-CM

## 2013-10-25 DIAGNOSIS — E291 Testicular hypofunction: Secondary | ICD-10-CM

## 2013-10-25 DIAGNOSIS — M542 Cervicalgia: Secondary | ICD-10-CM

## 2013-10-25 DIAGNOSIS — N400 Enlarged prostate without lower urinary tract symptoms: Secondary | ICD-10-CM

## 2013-10-25 MED ORDER — TADALAFIL 5 MG PO TABS: 5.0000 mg | ORAL_TABLET | Freq: Every day | ORAL | Status: DC | PRN

## 2013-10-25 MED ORDER — MELOXICAM 15 MG PO TABS: ORAL_TABLET | ORAL | Status: DC

## 2013-10-25 MED ORDER — AMITRIPTYLINE HCL 50 MG PO TABS: ORAL_TABLET | ORAL | Status: DC

## 2013-10-25 NOTE — Assessment & Plan Note (Signed)
This is likely related to cervical facet spondylosis and degenerative disc disease. Mobic, amitriptyline. Return in 3 weeks, MRI if no better.

## 2013-10-25 NOTE — Assessment & Plan Note (Signed)
Cialis 5 mg daily. Has tried and failed Viagra. Also has some erectile dysfunction.

## 2013-10-25 NOTE — Assessment & Plan Note (Signed)
Patient does continue testosterone supplementation, he did not have a sufficient response.

## 2013-10-25 NOTE — Assessment & Plan Note (Signed)
Doing extremely well. 

## 2013-10-25 NOTE — Progress Notes (Signed)
  Subjective:    CC: Follow up  HPI: Left osteoarthritis: Status post total hip arthroplasty doing extremely well.  Male hypogonadism: No improvement with testosterone injections, he does continue to have some obstructive uropathy as well as erectile dysfunction and would like to try a phosphodiesterase inhibitor.  Neck pain: Bilateral, localized in the upper shoulders, he does get a significant grinding sensation with left and right rotation of the neck. Conservative measures including Robaxin and a home rehabilitation exercises have been ineffective.  Past medical history, Surgical history, Family history not pertinant except as noted below, Social history, Allergies, and medications have been entered into the medical record, reviewed, and no changes needed.   Review of Systems: No fevers, chills, night sweats, weight loss, chest pain, or shortness of breath.   Objective:    General: Well Developed, well nourished, and in no acute distress.  Neuro: Alert and oriented x3, extra-ocular muscles intact, sensation grossly intact.  HEENT: Normocephalic, atraumatic, pupils equal round reactive to light, neck supple, no masses, no lymphadenopathy, thyroid nonpalpable.  Skin: Warm and dry, no rashes. Cardiac: Regular rate and rhythm, no murmurs rubs or gallops, no lower extremity edema.  Respiratory: Clear to auscultation bilaterally. Not using accessory muscles, speaking in full sentences.  Neck: Inspection unremarkable. No palpable stepoffs. Negative Spurling's maneuver. Full neck range of motion Grip strength and sensation normal in bilateral hands Strength good C4 to T1 distribution No sensory change to C4 to T1 Negative Hoffman sign bilaterally Reflexes normal  Impression and Recommendations:    I spent 40 minutes with this patient, greater than 50% was face to face time counseling regarding the below diagnoses.

## 2013-11-15 ENCOUNTER — Encounter: Payer: Self-pay | Admitting: Sports Medicine

## 2013-11-15 ENCOUNTER — Ambulatory Visit (INDEPENDENT_AMBULATORY_CARE_PROVIDER_SITE_OTHER): Payer: BC Managed Care – PPO | Admitting: Sports Medicine

## 2013-11-15 VITALS — BP 118/75 | HR 94 | Ht 73.0 in | Wt 190.0 lb

## 2013-11-15 DIAGNOSIS — M47812 Spondylosis without myelopathy or radiculopathy, cervical region: Secondary | ICD-10-CM

## 2013-11-15 MED ORDER — CYCLOBENZAPRINE HCL 5 MG PO TABS: 5.0000 mg | ORAL_TABLET | Freq: Three times a day (TID) | ORAL | Status: DC | PRN

## 2013-11-15 NOTE — Progress Notes (Signed)
  Subjective:    CC: Recheck cervical spondylosis  HPI: Cervical spondylosis: Overall doing well, he does get occasional twinges of pain without radicular symptoms. I have had him on tramadol, Mobic, amitriptyline, and Flexeril. He tells me that 5 mg of Flexeril as a fantastic job and knocked out his pain for many hours. He is happy with his current results, it does not desire to proceed any further with interventional management of his cervical spondylosis.  Past medical history, Surgical history, Family history not pertinant except as noted below, Social history, Allergies, and medications have been entered into the medical record, reviewed, and no changes needed.   Review of Systems: No fevers, chills, night sweats, weight loss, chest pain, or shortness of breath.   Objective:    General: Well Developed, well nourished, and in no acute distress.  Neuro: Alert and oriented x3, extra-ocular muscles intact, sensation grossly intact.  HEENT: Normocephalic, atraumatic, pupils equal round reactive to light, neck supple, no masses, no lymphadenopathy, thyroid nonpalpable.  Skin: Warm and dry, no rashes. Cardiac: Regular rate and rhythm, no murmurs rubs or gallops, no lower extremity edema.  Respiratory: Clear to auscultation bilaterally. Not using accessory muscles, speaking in full sentences. Neck: Inspection unremarkable. No palpable stepoffs. Negative Spurling's maneuver. Full neck range of motion Grip strength and sensation normal in bilateral hands Strength good C4 to T1 distribution No sensory change to C4 to T1 Negative Hoffman sign bilaterally Reflexes normal  Impression and Recommendations:

## 2013-11-15 NOTE — Assessment & Plan Note (Signed)
Overall doing extremely well with an occasional Flexeril. He can do 5 mg 3 times a day, return in 3 months. If he is no better we would obtain an MRI, he would need Ativan sedation prior to the MRI.

## 2014-02-15 ENCOUNTER — Encounter: Payer: Self-pay | Admitting: Sports Medicine

## 2014-02-15 ENCOUNTER — Ambulatory Visit (INDEPENDENT_AMBULATORY_CARE_PROVIDER_SITE_OTHER): Payer: BC Managed Care – PPO

## 2014-02-15 ENCOUNTER — Ambulatory Visit (INDEPENDENT_AMBULATORY_CARE_PROVIDER_SITE_OTHER): Payer: BC Managed Care – PPO | Admitting: Sports Medicine

## 2014-02-15 VITALS — BP 117/67 | HR 86 | Ht 73.0 in | Wt 188.0 lb

## 2014-02-15 DIAGNOSIS — M25559 Pain in unspecified hip: Secondary | ICD-10-CM

## 2014-02-15 DIAGNOSIS — M47812 Spondylosis without myelopathy or radiculopathy, cervical region: Secondary | ICD-10-CM

## 2014-02-15 DIAGNOSIS — Z96649 Presence of unspecified artificial hip joint: Secondary | ICD-10-CM

## 2014-02-15 DIAGNOSIS — N400 Enlarged prostate without lower urinary tract symptoms: Secondary | ICD-10-CM

## 2014-02-15 MED ORDER — TADALAFIL 5 MG PO TABS: 5.0000 mg | ORAL_TABLET | Freq: Every day | ORAL | Status: DC | PRN

## 2014-02-15 MED ORDER — CYCLOBENZAPRINE HCL 5 MG PO TABS: 5.0000 mg | ORAL_TABLET | Freq: Every day | ORAL | Status: DC

## 2014-02-15 NOTE — Assessment & Plan Note (Signed)
On a recent long hike with a 35 pound backpack he did develop a squeak in the right hip arthroplasty prosthesis. This has resolved in its pain-free, we are going to obtain some x-rays.

## 2014-02-15 NOTE — Assessment & Plan Note (Signed)
Refilling Cialis

## 2014-02-15 NOTE — Assessment & Plan Note (Signed)
Result of 5 mg of Flexeril at bedtime, refilling.

## 2014-02-15 NOTE — Progress Notes (Addendum)
    Subjective:    CC: Followup  HPI: Cervical spondylosis: Continues to be resolved with Flexeril at bedtime.  Right hip resurfacing: Went on a recent long hike, had some squeaking in his right hip, this resolved on its own, he wants to make sure that everything is normal.  BPH: Well controlled with daily Cialis.  Past medical history, Surgical history, Family history not pertinant except as noted below, Social history, Allergies, and medications have been entered into the medical record, reviewed, and no changes needed.   Review of Systems: No fevers, chills, night sweats, weight loss, chest pain, or shortness of breath.   Objective:    General: Well Developed, well nourished, and in no acute distress.  Neuro: Alert and oriented x3, extra-ocular muscles intact, sensation grossly intact.  HEENT: Normocephalic, atraumatic, pupils equal round reactive to light, neck supple, no masses, no lymphadenopathy, thyroid nonpalpable.  Skin: Warm and dry, no rashes. Cardiac: Regular rate and rhythm, no murmurs rubs or gallops, no lower extremity edema. Respiratory: Clear to auscultation bilaterally. Not using accessory muscles, speaking in full sentences. Right Hip: ROM IR: 45 Deg, ER: 45 Deg, Flexion: 120 Deg, Extension: 100 Deg, Abduction: 45 Deg, Adduction: 45 Deg Strength IR: 5/5, ER: 5/5, Flexion: 5/5, Extension: 5/5, Abduction: 5/5, Adduction: 5/5 Pelvic alignment unremarkable to inspection and palpation. Standing hip rotation and gait without trendelenburg sign / unsteadiness. Greater trochanter without tenderness to palpation. No tenderness over piriformis. No pain with FABER or FADIR. No SI joint tenderness and normal minimal SI movement.  X-rays reviewed and show nothing abnormal or any signs of loosening.  Impression and Recommendations:

## 2014-05-19 ENCOUNTER — Ambulatory Visit (INDEPENDENT_AMBULATORY_CARE_PROVIDER_SITE_OTHER): Payer: No Typology Code available for payment source | Admitting: Sports Medicine

## 2014-05-19 ENCOUNTER — Encounter: Payer: Self-pay | Admitting: Sports Medicine

## 2014-05-19 VITALS — BP 121/78 | HR 79 | Ht 73.0 in | Wt 186.0 lb

## 2014-05-19 DIAGNOSIS — M7020 Olecranon bursitis, unspecified elbow: Secondary | ICD-10-CM | POA: Diagnosis not present

## 2014-05-19 DIAGNOSIS — M7021 Olecranon bursitis, right elbow: Secondary | ICD-10-CM | POA: Insufficient documentation

## 2014-05-19 NOTE — Progress Notes (Signed)
Patient ID: Benjamin Ballard, male   DOB: [birth year], 57 y.o.   MRN: [id number]  Subjective:    CC: Mass on right elbow  HPI: Egan Berkheimer is a very pleasant 58 year old male who presents after falling onto the right elbow two months ago and subsequently developing a fluid-filled mass over the olecranon process. The mass has slowly grown over the past two months and is not painful.  Past medical history, Surgical history, Family history not pertinant except as noted below, Social history, Allergies, and medications have been entered into the medical record, reviewed, and no changes needed.   Objective:    General: Well developed, well nourished, and in no acute distress.  Neuro: Alert and oriented x3, extra-ocular muscles intact, sensation grossly intact.  HEENT: Normocephalic, atraumatic, pupils equal round reactive to light, neck supple, no masses, no lymphadenopathy, thyroid nonpalpable.  Skin: Warm and dry, no rashes. Cardiac: Regular rate and rhythm, no murmurs rubs or gallops, no lower extremity edema.  Respiratory: Clear to auscultation bilaterally. Not using accessory muscles, speaking in full sentences. Right Elbow: Unremarkable to inspection. Range of motion full pronation, supination, flexion, extension. Strength is full to all of the above directions Stable to varus, valgus stress. Negative moving valgus stress test. No discrete areas of tenderness to palpation. Ulnar nerve does not sublux. Negative cubital tunnel Tinel's. Fluctuant, fluid-filled sac over the olecranon process.  Procedure: Real-time Ultrasound Guided aspiration/Injection of right olecranon bursa Device: GE Logiq E  Verbal informed consent obtained.  Time-out conducted.  Noted no overlying erythema, induration, or other signs of local infection.  Skin prepped in a sterile fashion.  Local anesthesia: Topical Ethyl chloride.  With sterile technique and under real time ultrasound guidance:  Aspirated 5 cc of  serosanguineous fluid, syringe switched and 1 cc kenalog 40, 1 cc lidocaine injected easily. Completed without difficulty  Pain immediately resolved suggesting accurate placement of the medication.  Advised to call if fevers/chills, erythema, induration, drainage, or persistent bleeding.  Images permanently stored and available for review in the ultrasound unit.  Impression: Technically successful ultrasound guided injection.  Impression and Recommendations:   Right Olecranon Bursitis: Aspiration and injection of the olecranon bursa was performed in the office today, and the elbow was strapped with compressive dressing. - Return in one month

## 2014-05-19 NOTE — Assessment & Plan Note (Signed)
Aspiration and injection as above. Strap with compressive dressing. Return in one month.

## 2014-06-16 ENCOUNTER — Ambulatory Visit: Payer: No Typology Code available for payment source | Admitting: Sports Medicine

## 2014-11-08 ENCOUNTER — Ambulatory Visit (INDEPENDENT_AMBULATORY_CARE_PROVIDER_SITE_OTHER): Payer: 59 | Admitting: Sports Medicine

## 2014-11-08 ENCOUNTER — Encounter: Payer: Self-pay | Admitting: Sports Medicine

## 2014-11-08 VITALS — BP 122/73 | HR 83 | Ht 73.0 in | Wt 189.0 lb

## 2014-11-08 DIAGNOSIS — M7021 Olecranon bursitis, right elbow: Secondary | ICD-10-CM | POA: Diagnosis not present

## 2014-11-08 NOTE — Patient Instructions (Signed)
Get your heart rate up to 130 and keep it there for 30 minutes 5 times a week.

## 2014-11-08 NOTE — Assessment & Plan Note (Signed)
Six-month response to the previous aspiration and injection. Unfortunately had repeat trauma. Xrays, aspiration and injection. Elbow sport sleeve given. Return as needed

## 2014-11-08 NOTE — Progress Notes (Signed)
  Subjective:    CC: Elbow swelling  HPI: This is a pleasant 59 year old male, he had an episode of traumatic olecranon bursitis on the right side last year, a good six-month response to an aspiration. Recently he banged his elbow and has a recurrence of swelling. Moderate, persistent, localized at the elbow without constitutional symptoms or drainage.  Past medical history, Surgical history, Family history not pertinant except as noted below, Social history, Allergies, and medications have been entered into the medical record, reviewed, and no changes needed.   Review of Systems: No fevers, chills, night sweats, weight loss, chest pain, or shortness of breath.   Objective:    General: Well Developed, well nourished, and in no acute distress.  Neuro: Alert and oriented x3, extra-ocular muscles intact, sensation grossly intact.  HEENT: Normocephalic, atraumatic, pupils equal round reactive to light, neck supple, no masses, no lymphadenopathy, thyroid nonpalpable.  Skin: Warm and dry, no rashes. Cardiac: Regular rate and rhythm, no murmurs rubs or gallops, no lower extremity edema.  Respiratory: Clear to auscultation bilaterally. Not using accessory muscles, speaking in full sentences. Right Elbow: Visibly swollen olecranon bursa without tenderness or erythema or induration. Range of motion full pronation, supination, flexion, extension. Strength is full to all of the above directions Stable to varus, valgus stress. Negative moving valgus stress test. No discrete areas of tenderness to palpation. Ulnar nerve does not sublux. Negative cubital tunnel Tinel's.  Procedure: Real-time Ultrasound Guided aspiration/Injection of right olecranon bursa Device: GE Logiq E  Verbal informed consent obtained.  Time-out conducted.  Noted no overlying erythema, induration, or other signs of local infection.  Skin prepped in a sterile fashion.  Local anesthesia: Topical Ethyl chloride.  With sterile  technique and under real time ultrasound guidance:  Using an 18-gauge needle and aspirated 10 mL of serosanguineous fluid, syringe switched and 1 mL kenalog 40, 2 mL lidocaine injected easily. Completed without difficulty  Pain immediately resolved suggesting accurate placement of the medication.  Advised to call if fevers/chills, erythema, induration, drainage, or persistent bleeding.  Images permanently stored and available for review in the ultrasound unit.  Impression: Technically successful ultrasound guided injection.  Impression and Recommendations:

## 2014-12-16 IMAGING — CR DG HIP (WITH OR WITHOUT PELVIS) 2-3V*L*
3 series · 3 of 3 positions shown · non-contrast
Comparison: None

CLINICAL DATA: Preoperative evaluation for left hip surgery,
osteoarthritis

EXAM:
LEFT HIP - COMPLETE 2+ VIEW

[t pelvis a.p.]
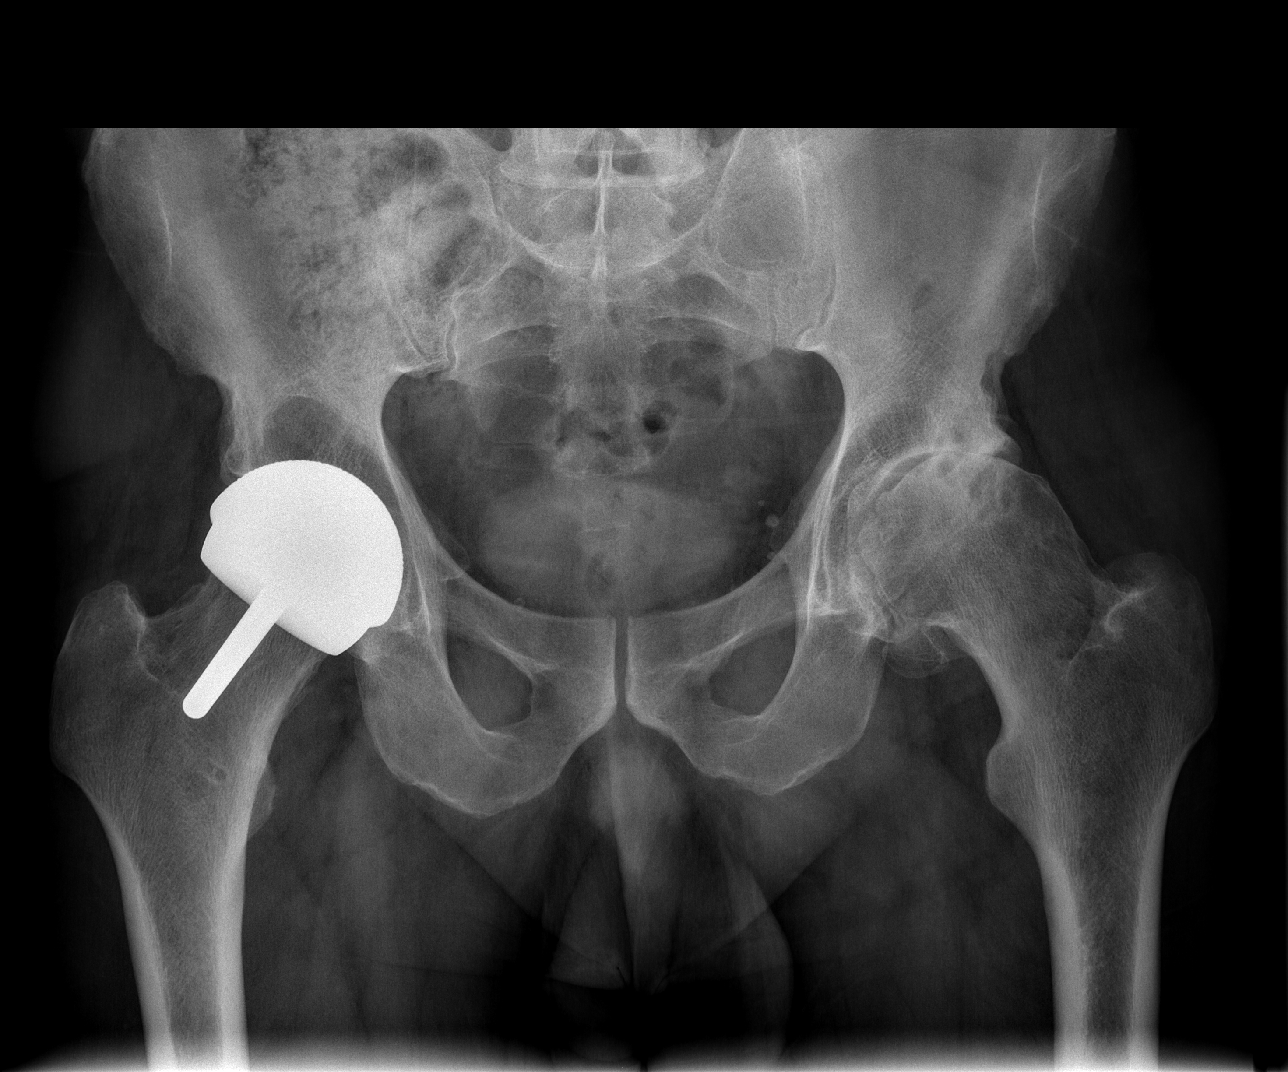

[t hip ap left]
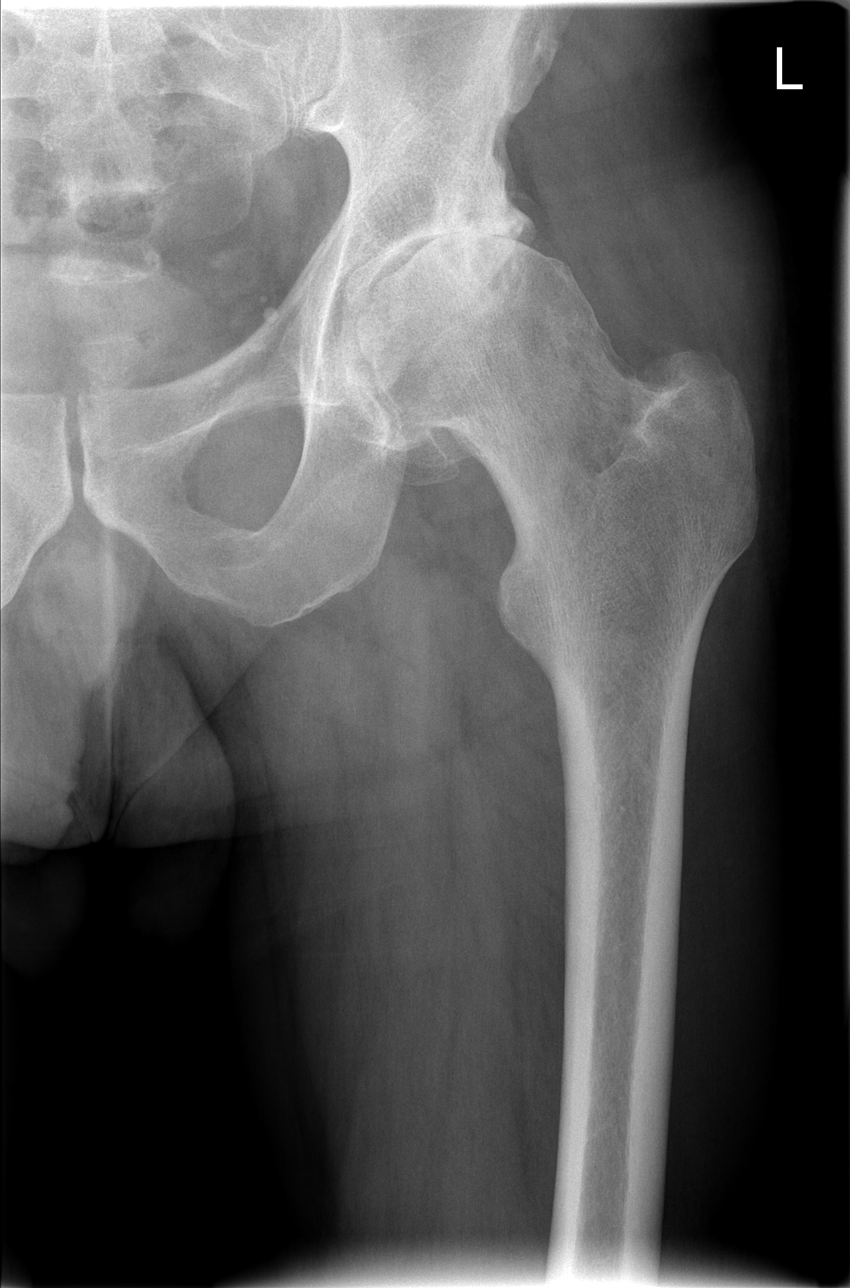

[t hip frog leg left]
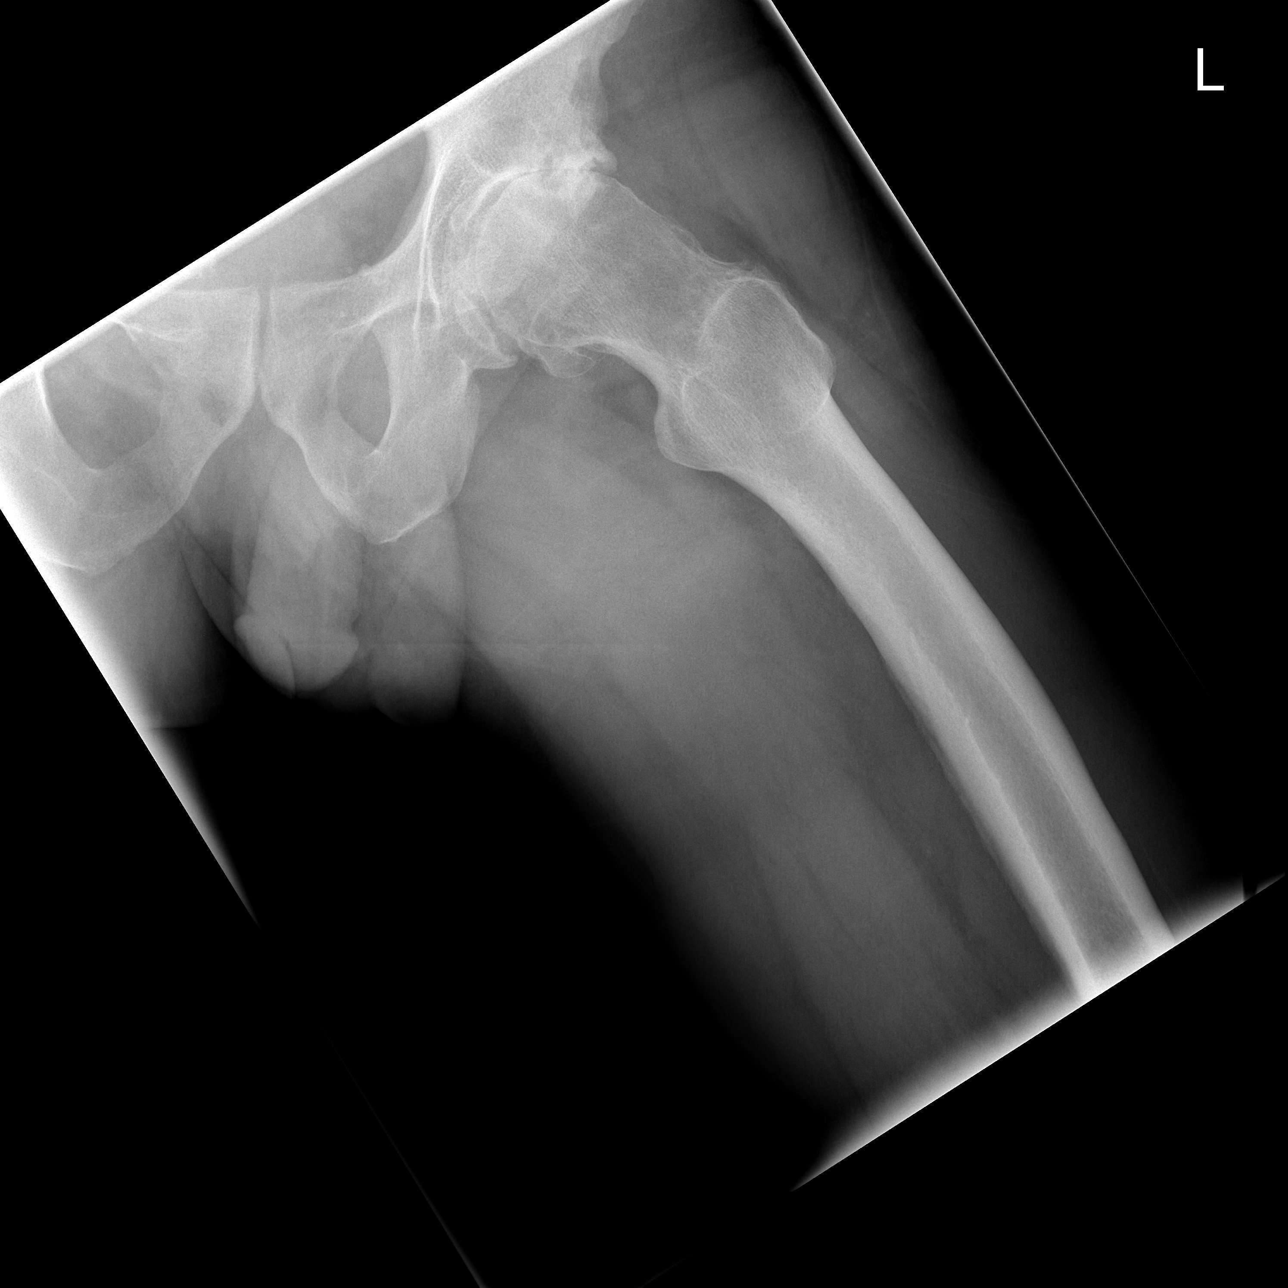

[3 of 3 positions shown; findings below may reference images not displayed]

FINDINGS: Osseous demineralization.

Prior right hip replacement.

Advanced osteoarthritic changes of the left hip with joint space
narrowing and spur formation as well as subchondral cystic change
particularly superiorly.

Bone-on-bone appearance superiorly.

No acute fracture, dislocation, or bone destruction.

Small left pelvic phleboliths noted.
IMPRESSION: Advanced osteoarthritic changes of the left hip.

## 2014-12-23 IMAGING — CR DG PORTABLE PELVIS
1 series · 1 of 1 positions shown · non-contrast
Comparison: Preoperative radiographs 08/18/2013

CLINICAL DATA: Postop left hip replacement

EXAM:
PORTABLE PELVIS 1-2 VIEWS

[AP]
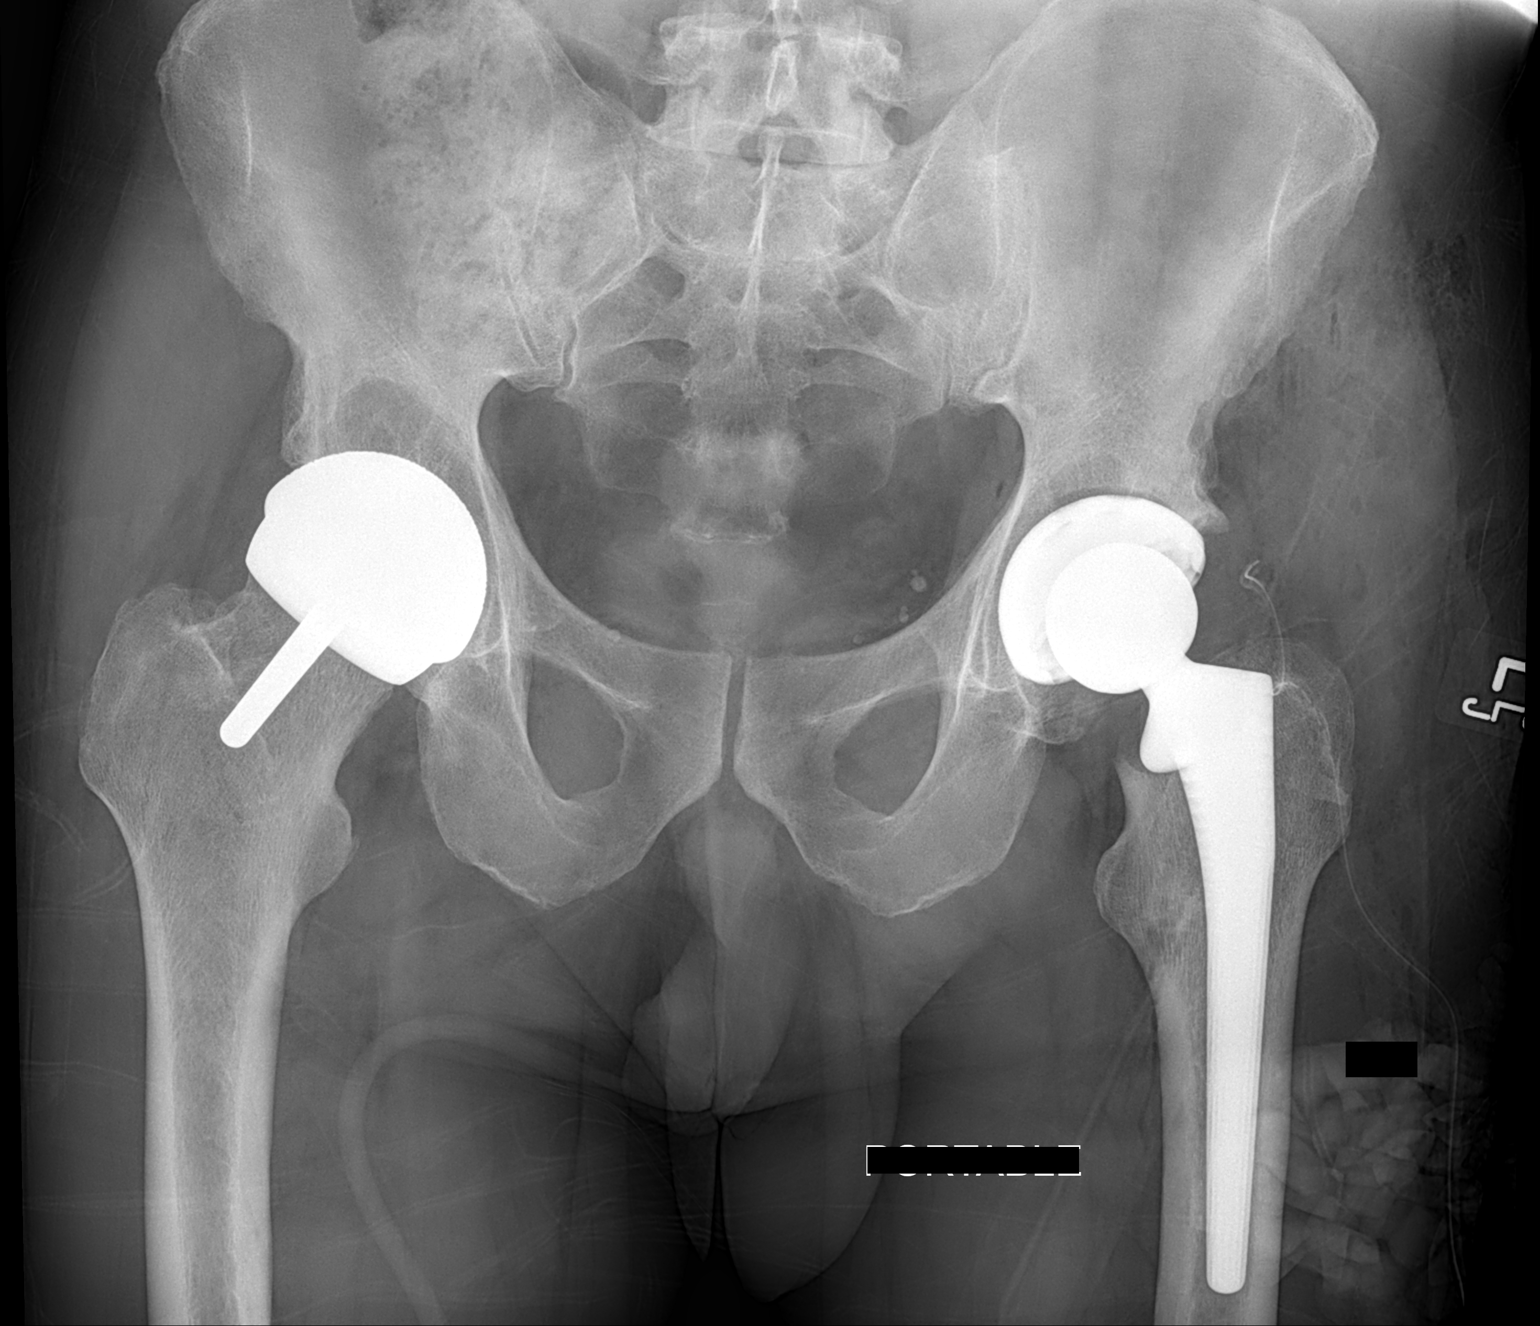

[1 of 1 positions shown; findings below may reference images not displayed]

FINDINGS: Interval surgical changes of left total hip arthroplasty. A surgical
drain projects over the left greater trochanter. The femoral head
component appears located with respect to the acetabular component.
No evidence of immediate hardware complication. Surgical changes of
remote right hip Tenk resurfacing procedure are also evident.
There is stable lucency about the acetabular component. Bony
mineralization is otherwise within normal limits.
IMPRESSION: Surgical changes of interval left hip arthroplasty without evidence
of immediate hardware complication.

## 2015-06-15 IMAGING — CR DG HIP COMPLETE 2+V*R*
4 series · 4 of 4 positions shown · non-contrast
Comparison: Prior pelvic radiograph 08/25/2013

CLINICAL DATA: Two month history of progressive right hip pain, no
known injury

EXAM:
RIGHT HIP - COMPLETE 2+ VIEW

[view not recorded (1 of 4)]
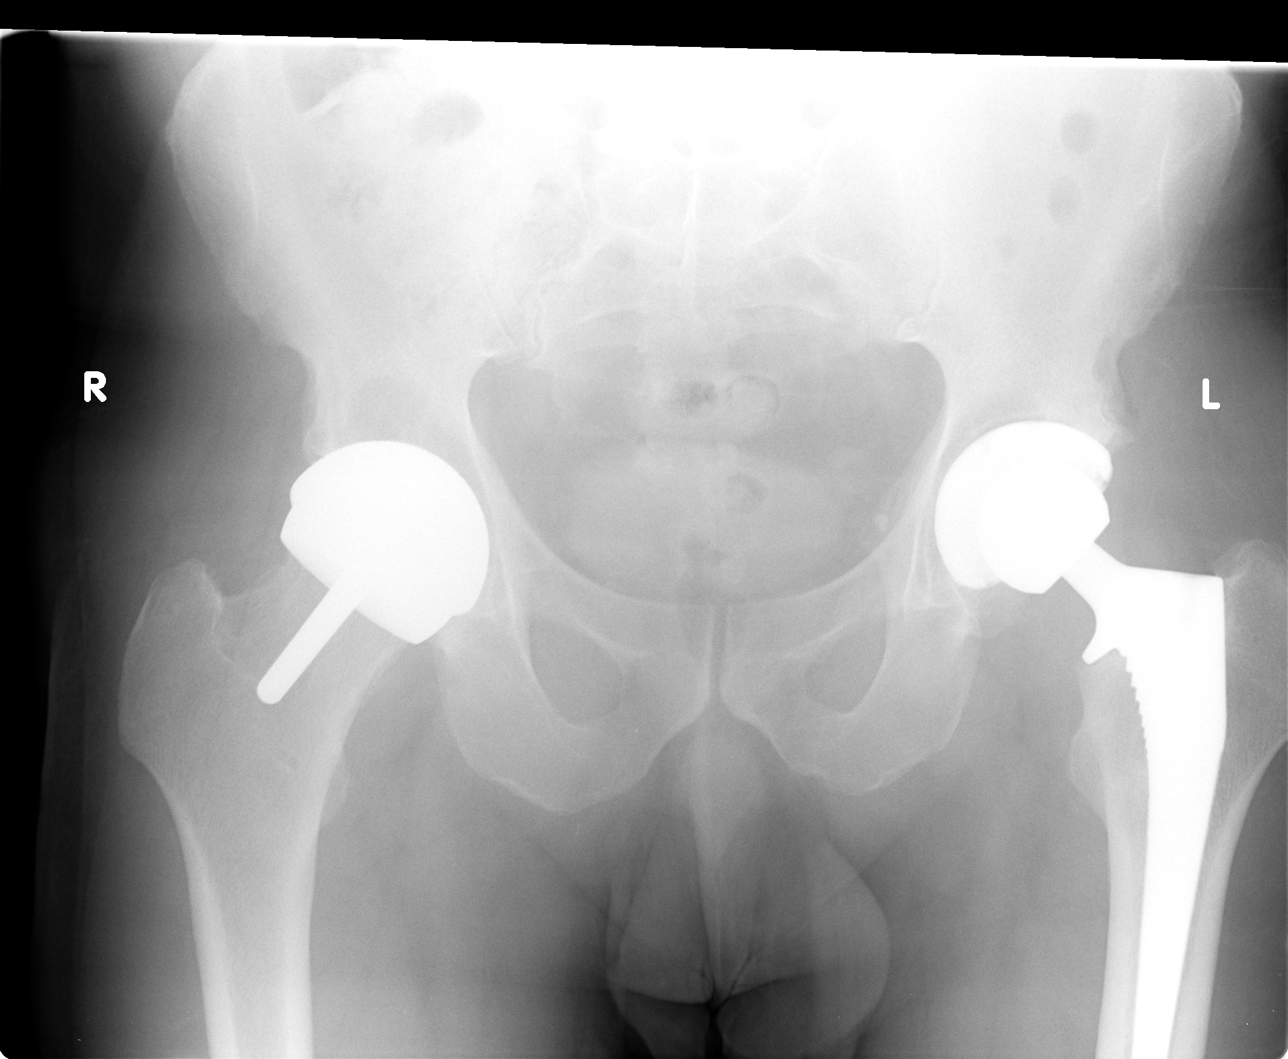

[view not recorded (2 of 4)]
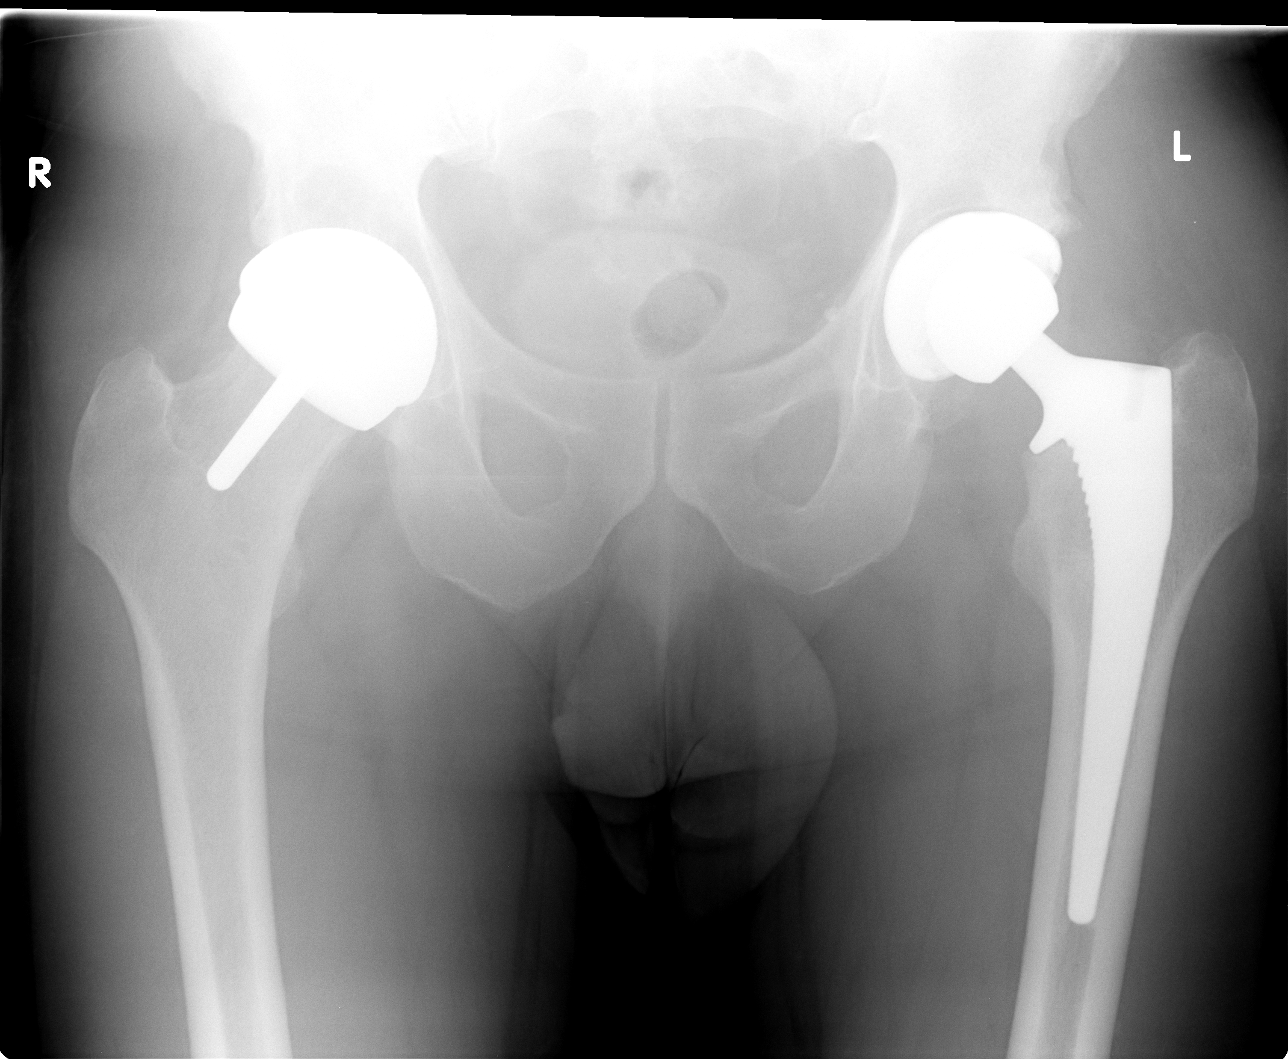

[view not recorded (3 of 4)]
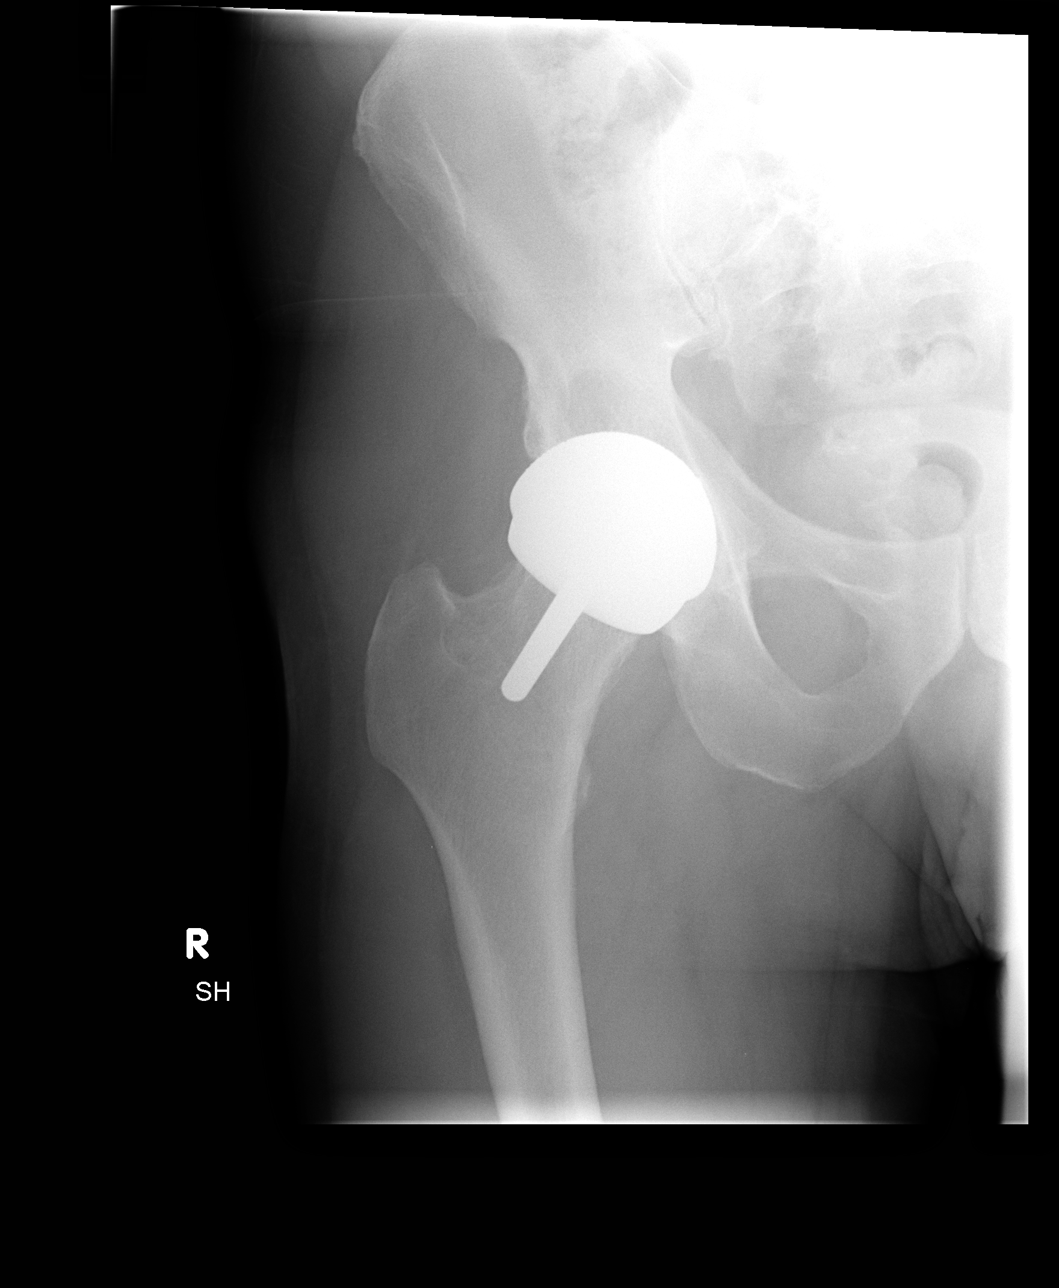

[view not recorded (4 of 4)]
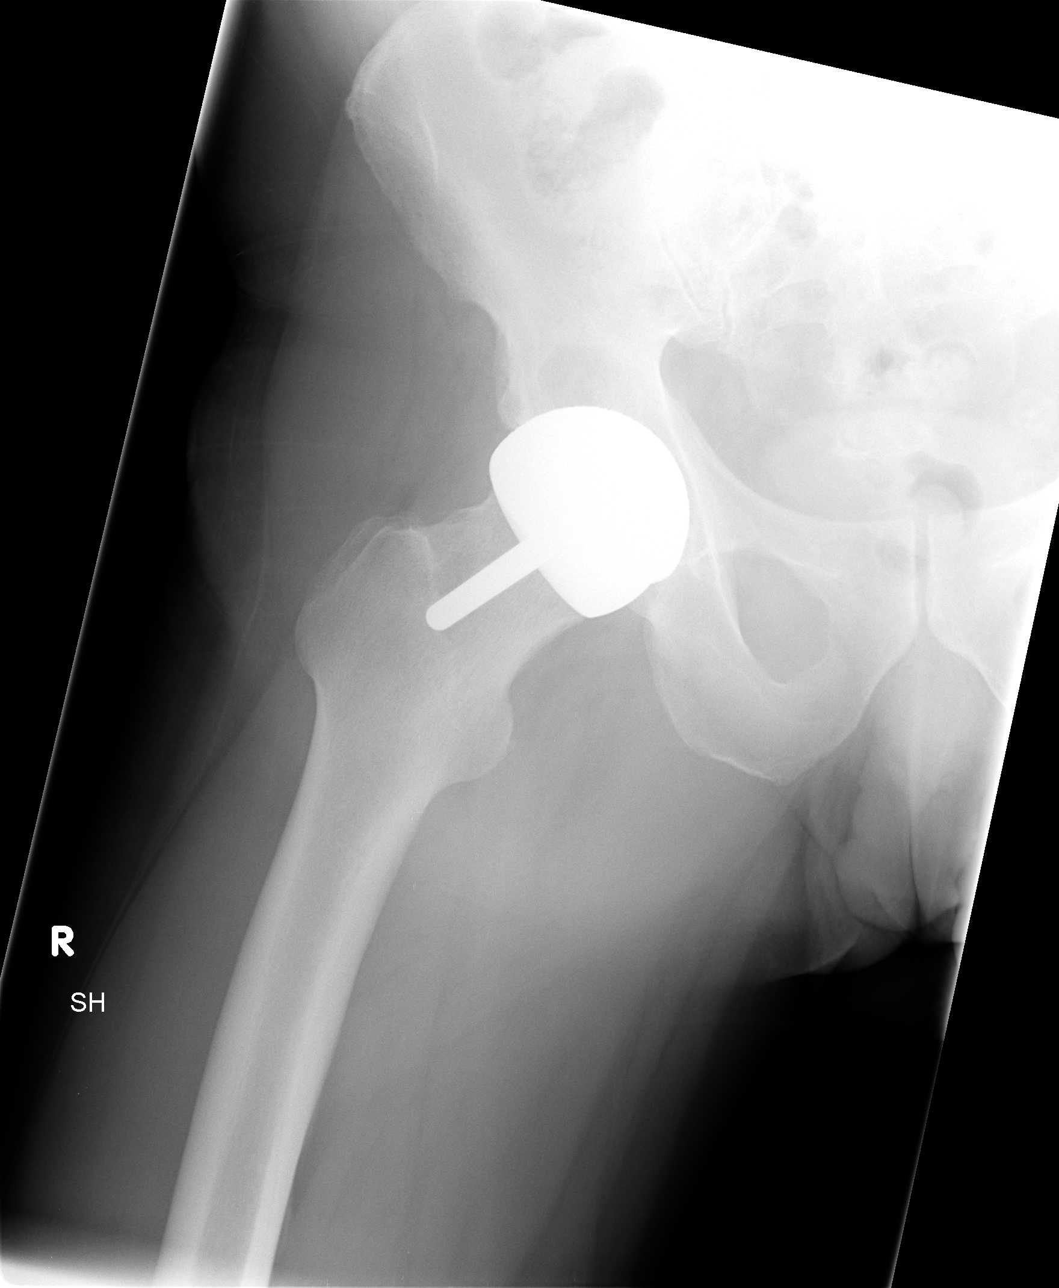

[4 of 4 positions shown; findings below may reference images not displayed]

FINDINGS: Surgical changes of prior right hip resurfacing and left total hip
arthroplasty. No evidence of hardware complication involving either
prosthesis. No acute fracture or malalignment. No focal soft tissue
abnormality. Normal bony middle RI sedation. No lytic or blastic
osseous lesion.
IMPRESSION: 1. No acute fracture, malalignment or evidence of osseous
abnormality.
2. Surgical changes of right femoral head resurfacing procedure
without evidence of complication.
3. Surgical changes of left total hip arthroplasty without evidence
of complication.

## 2017-11-06 ENCOUNTER — Encounter: Payer: Self-pay | Admitting: Physician Assistant

## 2017-11-06 ENCOUNTER — Ambulatory Visit (INDEPENDENT_AMBULATORY_CARE_PROVIDER_SITE_OTHER): Payer: BLUE CROSS/BLUE SHIELD

## 2017-11-06 ENCOUNTER — Ambulatory Visit: Payer: BLUE CROSS/BLUE SHIELD | Admitting: Physician Assistant

## 2017-11-06 VITALS — BP 122/88 | HR 93 | Temp 98.5°F | Wt 182.0 lb

## 2017-11-06 DIAGNOSIS — R42 Dizziness and giddiness: Secondary | ICD-10-CM | POA: Diagnosis not present

## 2017-11-06 DIAGNOSIS — R062 Wheezing: Secondary | ICD-10-CM

## 2017-11-06 DIAGNOSIS — Q433 Congenital malformations of intestinal fixation: Secondary | ICD-10-CM

## 2017-11-06 DIAGNOSIS — Z20828 Contact with and (suspected) exposure to other viral communicable diseases: Secondary | ICD-10-CM

## 2017-11-06 DIAGNOSIS — R509 Fever, unspecified: Secondary | ICD-10-CM

## 2017-11-06 DIAGNOSIS — J989 Respiratory disorder, unspecified: Secondary | ICD-10-CM | POA: Diagnosis not present

## 2017-11-06 DIAGNOSIS — R05 Cough: Secondary | ICD-10-CM | POA: Diagnosis not present

## 2017-11-06 HISTORY — DX: Congenital malformations of intestinal fixation: Q43.3

## 2017-11-06 MED ORDER — ALBUTEROL SULFATE HFA 108 (90 BASE) MCG/ACT IN AERS
1.0000 | INHALATION_SPRAY | RESPIRATORY_TRACT | 0 refills | Status: DC | PRN
Start: 2017-11-06 — End: 2019-02-10

## 2017-11-06 MED ORDER — PREDNISONE 20 MG PO TABS: 40.0000 mg | ORAL_TABLET | Freq: Every day | ORAL | 0 refills | Status: AC

## 2017-11-06 MED ORDER — IPRATROPIUM-ALBUTEROL 0.5-2.5 (3) MG/3ML IN SOLN
3.0000 mL | Freq: Once | RESPIRATORY_TRACT | Status: AC
  Administered 2017-11-06: 3 mL via RESPIRATORY_TRACT

## 2017-11-06 NOTE — Progress Notes (Signed)
Hi Benjamin Ballard,  Your chest x-ray was negative for pneumonia.  There was an incidental finding of a rare anatomic sign where your colon sits higher on the right side. This is called chilaiditi sign. It does not require any further testing or treatment. If you develop right-sided upper abdominal pain, please let me know.  Best, Vinetta Bergamoharley

## 2017-11-06 NOTE — Patient Instructions (Addendum)
Bronchospasm, Adult Bronchospasm is when airways in the lungs get smaller. When this happens, it can be hard to breathe. You may cough. You may also make a whistling sound when you breathe (wheeze). Follow these instructions at home: Medicines  Take over-the-counter and prescription medicines only as told by your doctor.  If you need to use an inhaler or nebulizer to take your medicine, ask your doctor how to use it.  If you were given a spacer, always use it with your inhaler. Lifestyle  Change your heating and air conditioning filter. Do this at least once a month.  Try not to use fireplaces and wood stoves.  Do not  smoke. Do not  allow smoking in your home.  Try not to use things that have a strong smell, like perfume.  Get rid of pests (such as roaches and mice) and their poop.  Remove any mold from your home.  Keep your house clean. Get rid of dust.  Use cleaning products that have no smell.  Replace carpet with wood, tile, or vinyl flooring.  Use allergy-proof pillows, mattress covers, and box spring covers.  Wash bed sheets and blankets every week. Use hot water. Dry them in a dryer.  Use blankets that are made of polyester or cotton.  Wash your hands often.  Keep pets out of your bedroom.  When you exercise, try not to breathe in cold air. General instructions  Have a plan for getting medical care. Know these things: ? When to call your doctor. ? When to call local emergency services (911 in the [place] ? Where to go in an emergency.  Stay up to date on your shots (immunizations).  When you have an episode: ? Stay calm. ? Relax. ? Breathe slowly. Contact a doctor if:  Your muscles ache.  Your chest hurts.  The color of the mucus you cough up (sputum) changes from clear or white to yellow, green, gray, or bloody.  The mucus you cough up gets thicker.  You have a fever. Get help right away if:  The whistling sound gets worse, even after you  take your medicines.  Your coughing gets worse.  You find it even harder to breathe.  Your chest hurts very much. Summary  Bronchospasm is when airways in the lungs get smaller.  When this happens, it can be hard to breathe. You may cough. You may also make a whistling sound when you breathe.  Stay away from things that cause you to have episodes. These include smoke or dust. This information is not intended to replace advice given to you by your health care provider. Make sure you discuss any questions you have with your health care provider. Document Released: 06/23/2009 Document Revised: 08/29/2016 Document Reviewed: 08/29/2016 Elsevier Interactive Patient Education  2017 Elsevier Inc.   Vertigo Vertigo means that you feel like you are moving when you are not. Vertigo can also make you feel like things around you are moving when they are not. This feeling can come and go at any time. Vertigo often goes away on its own. Follow these instructions at home:  Avoid making fast movements.  Avoid driving.  Avoid using heavy machinery.  Avoid doing any task or activity that might cause danger to you or other people if you would have a vertigo attack while you are doing it.  Sit down right away if you feel dizzy or have trouble with your balance.  Take over-the-counter and prescription medicines only as told by  your doctor.  Follow instructions from your doctor about which positions or movements you should avoid.  Drink enough fluid to keep your pee (urine) clear or pale yellow.  Keep all follow-up visits as told by your doctor. This is important. Contact a doctor if:  Medicine does not help your vertigo.  You have a fever.  Your problems get worse or you have new symptoms.  Your family or friends see changes in your behavior.  You feel sick to your stomach (nauseous) or you throw up (vomit).  You have a "pins and needles" feeling or you are numb in part of your  body. Get help right away if:  You have trouble moving or talking.  You are always dizzy.  You pass out (faint).  You get very bad headaches.  You feel weak or have trouble using your hands, arms, or legs.  You have changes in your hearing.  You have changes in your seeing (vision).  You get a stiff neck.  Bright light starts to bother you. This information is not intended to replace advice given to you by your health care provider. Make sure you discuss any questions you have with your health care provider. Document Released: 06/04/2008 Document Revised: 02/01/2016 Document Reviewed: 12/19/2014 Elsevier Interactive Patient Education  [employer].

## 2017-11-06 NOTE — Progress Notes (Signed)
HPI:                                                                Benjamin Ballard is a 62 y.o. male who presents to [facility] today for wheezing / dizziness  Influenza  This is a new problem. The current episode started in the past 7 days (x 6 days). The problem occurs constantly. Associated symptoms include chills, congestion, coughing, fatigue, a fever (highest 103), vertigo and weakness. Pertinent negatives include no neck pain, sore throat or vomiting. The symptoms are aggravated by bending. He has tried NSAIDs (Theraflu) for the symptoms.  Son was diagnosed with influenza last week  No flowsheet data found.  No flowsheet data found.    Past Medical History:  Diagnosis Date  . Arthritis    oa  . GERD (gastroesophageal reflux disease)    no recent reflux meds   Past Surgical History:  Procedure Laterality Date  . JOINT REPLACEMENT  03-2011   right hip  . TOTAL HIP ARTHROPLASTY Left 08/25/2013   Procedure: LEFT TOTAL HIP ARTHROPLASTY ANTERIOR APPROACH;  Surgeon: Loanne Drilling, MD;  Location: [facility];  Service: Orthopedics;  Laterality: Left;   Social History   Tobacco Use  . Smoking status: Passive Smoke Exposure - Never Smoker  . Smokeless tobacco: Never Used  Substance Use Topics  . Alcohol use: Yes    Comment: occasional   family history includes Cancer in his father.    ROS: negative except as noted in the HPI  Medications: No current outpatient medications on file.   No current facility-administered medications for this visit.    Allergies  Allergen Reactions  . Oxycodone Hives       Objective:  BP 122/88   Pulse 93   Temp 98.5 F (36.9 C) (Oral)   Wt 182 lb (82.6 kg)   SpO2 97%   BMI 24.01 kg/m  Gen:  alert, ill-appearing, not toxic-appearing, no distress, appropriate for age HEENT: head normocephalic without obvious abnormality, conjunctiva and cornea clear, wearing glasses, TM's clear,  oropharynx clear, mucous membranes pale and dry, nasal mucosa edematous, neck supple, no cervical adenopathy, trachea midline Pulm: Normal work of breathing, voice is hoarse, diffuse inspiratory and end-expiratory wheezes CV: Normal rate, regular rhythm, s1 and s2 distinct, no murmurs, clicks or rubs  Neuro: alert and oriented x 3, no tremor MSK: extremities atraumatic, normal gait and station Skin: intact, no rashes on exposed skin, no jaundice, no cyanosis     No results found for this or any previous visit (from the past 72 hour(s)). No results found.    Assessment and Plan: 62 y.o. male with   1. Febrile respiratory illness - SpO2 97% on RA at rest, no evidence of respiratory distress, vitals are stable - DG Chest 2 View to assess for infiltrate - predniSONE (DELTASONE) 20 MG tablet; Take 2 tablets (40 mg total) by mouth daily with breakfast for 5 days.  Dispense: 10 tablet; Refill: 0  2. Wheezing - DuoNeb given in office today. Symptomatic management with beta agonist inhaler and oral steroids - albuterol (PROVENTIL HFA;VENTOLIN HFA) 108 (90 Base) MCG/ACT inhaler; Inhale 1-2 puffs into the lungs every 4 (four) hours as needed for wheezing or  shortness of breath.  Dispense: 1 Inhaler; Refill: 0  3. Dizziness   4. Exposure to influenza - he is symptomatic and outside the window for Tamiflu     Patient education and anticipatory guidance given Patient agrees with treatment plan Follow-up as needed if symptoms worsen or fail to improve  Levonne Hubertharley E. Artemis Loyal PA-C

## 2017-11-13 ENCOUNTER — Ambulatory Visit: Payer: BLUE CROSS/BLUE SHIELD | Admitting: Physician Assistant

## 2017-11-17 ENCOUNTER — Encounter: Payer: Self-pay | Admitting: Physician Assistant

## 2017-12-05 ENCOUNTER — Other Ambulatory Visit: Payer: Self-pay | Admitting: Physician Assistant

## 2017-12-05 DIAGNOSIS — R062 Wheezing: Secondary | ICD-10-CM

## 2019-02-10 ENCOUNTER — Encounter: Payer: Self-pay | Admitting: Sports Medicine

## 2019-02-10 ENCOUNTER — Ambulatory Visit: Payer: BLUE CROSS/BLUE SHIELD | Admitting: Sports Medicine

## 2019-02-10 DIAGNOSIS — J00 Acute nasopharyngitis [common cold]: Secondary | ICD-10-CM

## 2019-02-10 DIAGNOSIS — E039 Hypothyroidism, unspecified: Secondary | ICD-10-CM | POA: Diagnosis not present

## 2019-02-10 DIAGNOSIS — N400 Enlarged prostate without lower urinary tract symptoms: Secondary | ICD-10-CM

## 2019-02-10 DIAGNOSIS — Z299 Encounter for prophylactic measures, unspecified: Secondary | ICD-10-CM | POA: Diagnosis not present

## 2019-02-10 DIAGNOSIS — E038 Other specified hypothyroidism: Secondary | ICD-10-CM

## 2019-02-10 DIAGNOSIS — J310 Chronic rhinitis: Secondary | ICD-10-CM | POA: Insufficient documentation

## 2019-02-10 MED ORDER — FLUTICASONE PROPIONATE 50 MCG/ACT NA SUSP: NASAL | 3 refills | Status: AC

## 2019-02-10 NOTE — Assessment & Plan Note (Signed)
Unclear etiology, viral versus allergic. Adding Flonase, no fevers, chills, muscle aches, body aches, only minimal sore throat, no fatigue. Return as needed.

## 2019-02-10 NOTE — Assessment & Plan Note (Signed)
Getting routine labs

## 2019-02-10 NOTE — Progress Notes (Addendum)
Subjective:    CC: Stuffy nose  HPI: For the past several days Benjamin Ballard has had a runny nose, occasional stuffiness, no fevers, chills, muscle aches, body aches, no sore throat, mild cough, no fatigue, no facial pain or pressure.  Symptoms are mild, persistent.  I reviewed the past medical history, family history, social history, surgical history, and allergies today and no changes were needed.  Please see the problem list section below in epic for further details.  Past Medical History: Past Medical History:  Diagnosis Date  . Arthritis    oa  . Chilaiditi's sign 11/06/2017   Asymptomatic, incidental finding on CXR  . GERD (gastroesophageal reflux disease)    no recent reflux meds   Past Surgical History: Past Surgical History:  Procedure Laterality Date  . JOINT REPLACEMENT  03-2011   right hip  . TOTAL HIP ARTHROPLASTY Left 08/25/2013   Procedure: LEFT TOTAL HIP ARTHROPLASTY ANTERIOR APPROACH;  Surgeon: Loanne Drilling, MD;  Location: [facility];  Service: Orthopedics;  Laterality: Left;   Social History: Social History   Socioeconomic History  . Marital status: Married    Spouse name: Not on file  . Number of children: Not on file  . Years of education: Not on file  . Highest education level: Not on file  Occupational History  . Not on file  Social Needs  . Financial resource strain: Not on file  . Food insecurity    Worry: Not on file    Inability: Not on file  . Transportation needs    Medical: Not on file    Non-medical: Not on file  Tobacco Use  . Smoking status: Passive Smoke Exposure - Never Smoker  . Smokeless tobacco: Never Used  Substance and Sexual Activity  . Alcohol use: Yes    Comment: occasional  . Drug use: No  . Sexual activity: Not on file  Lifestyle  . Physical activity    Days per week: Not on file    Minutes per session: Not on file  . Stress: Not on file  Relationships  . Social Musician on phone: Not on file    Gets together:  Not on file    Attends religious service: Not on file    Active member of club or organization: Not on file    Attends meetings of clubs or organizations: Not on file    Relationship status: Not on file  Other Topics Concern  . Not on file  Social History Narrative  . Not on file   Family History: Family History  Problem Relation Age of Onset  . Cancer Father        COLON   Allergies: Allergies  Allergen Reactions  . Oxycodone Hives   Medications: See med rec.  Review of Systems: No fevers, chills, night sweats, weight loss, chest pain, or shortness of breath.   Objective:    General: Well Developed, well nourished, and in no acute distress.  Neuro: Alert and oriented x3, extra-ocular muscles intact, sensation grossly intact.  HEENT: Normocephalic, atraumatic, pupils equal round reactive to light, neck supple, no masses, no lymphadenopathy, thyroid nonpalpable.  Oropharynx, nasopharynx, ear canals unremarkable with the exception of minimally boggy and erythematous turbinates Skin: Warm and dry, no rashes. Cardiac: Regular rate and rhythm, no murmurs rubs or gallops, no lower extremity edema.  Respiratory: Clear to auscultation bilaterally. Not using accessory muscles, speaking in full sentences.  Impression and Recommendations:    Rhinitis Unclear etiology,  viral versus allergic. Adding Flonase, no fevers, chills, muscle aches, body aches, only minimal sore throat, no fatigue. Return as needed.  Preventive measure Getting routine labs.  Subclinical hypothyroidism Persistent slightly elevated TSH, adding T3 and T4 levels. I still suspect this is subclinical hypothyroidism but we will monitor for progression to clinical hypothyroidism.   ___________________________________________ Ihor Austinhomas J. Benjamin Stainhekkekandam, M.D., ABFM., CAQSM. Primary Care and Sports [facility]  Adjunct Professor of Family Medicine  [employer]

## 2019-02-19 LAB — COMPLETE METABOLIC PANEL WITH GFR
AG Ratio: 1.4 (calc) (ref 1.0–2.5)
ALT: 31 U/L (ref 9–46)
AST: 31 U/L (ref 10–35)
Albumin: 4.1 g/dL (ref 3.6–5.1)
Alkaline phosphatase (APISO): 78 U/L (ref 35–144)
BUN: 17 mg/dL (ref 7–25)
CO2: 26 mmol/L (ref 20–32)
Calcium: 9.3 mg/dL (ref 8.6–10.3)
Chloride: 103 mmol/L (ref 98–110)
Creat: 1.2 mg/dL (ref 0.70–1.25)
GFR, Est African American: 75 mL/min/{1.73_m2} (ref >=60)
GFR, Est Non African American: 64 mL/min/{1.73_m2} (ref >=60)
Globulin: 3 g/dL (calc) (ref 1.9–3.7)
Glucose, Bld: 94 mg/dL (ref 65–99)
Potassium: 4.2 mmol/L (ref 3.5–5.3)
Sodium: 140 mmol/L (ref 135–146)
Total Bilirubin: 0.6 mg/dL (ref 0.2–1.2)
Total Protein: 7.1 g/dL (ref 6.1–8.1)

## 2019-02-19 LAB — LIPID PANEL W/REFLEX DIRECT LDL
Cholesterol: 193 mg/dL (ref <200)
HDL: 35 mg/dL — ABNORMAL LOW (ref >=40)
LDL Cholesterol (Calc): 131 mg/dL (calc) — ABNORMAL HIGH
Non-HDL Cholesterol (Calc): 158 mg/dL (calc) — ABNORMAL HIGH (ref <130)
Total CHOL/HDL Ratio: 5.5 (calc) — ABNORMAL HIGH (ref <5.0)
Triglycerides: 154 mg/dL — ABNORMAL HIGH (ref <150)

## 2019-02-19 LAB — CBC
HCT: 44.9 % (ref 38.5–50.0)
Hemoglobin: 15.1 g/dL (ref 13.2–17.1)
MCH: 30.7 pg (ref 27.0–33.0)
MCHC: 33.6 g/dL (ref 32.0–36.0)
MCV: 91.3 fL (ref 80.0–100.0)
MPV: 10.8 fL (ref 7.5–12.5)
Platelets: 273 10*3/uL (ref 140–400)
RBC: 4.92 10*6/uL (ref 4.20–5.80)
RDW: 12.8 % (ref 11.0–15.0)
WBC: 4.5 10*3/uL (ref 3.8–10.8)

## 2019-02-19 LAB — HEMOGLOBIN A1C
Hgb A1c MFr Bld: 5.7 % of total Hgb — ABNORMAL HIGH (ref <5.7)
Mean Plasma Glucose: 117 (calc)
eAG (mmol/L): 6.5 (calc)

## 2019-02-19 LAB — TSH: TSH: 4.67 mIU/L — ABNORMAL HIGH (ref 0.40–4.50)

## 2019-02-19 LAB — PSA, TOTAL AND FREE
PSA, % Free: 50 % (calc) (ref >25)
PSA, Free: 0.1 ng/mL
PSA, Total: 0.2 ng/mL (ref <=4.0)

## 2019-02-19 LAB — T3, FREE: T3, Free: 3 pg/mL (ref 2.3–4.2)

## 2019-02-19 LAB — VITAMIN D 25 HYDROXY (VIT D DEFICIENCY, FRACTURES): Vit D, 25-Hydroxy: 28 ng/mL — ABNORMAL LOW (ref 30–100)

## 2019-02-19 LAB — T4, FREE: Free T4: 1.1 ng/dL (ref 0.8–1.8)

## 2019-02-19 MED ORDER — VITAMIN D (ERGOCALCIFEROL) 1.25 MG (50000 UNIT) PO CAPS: 50000.0000 [IU] | ORAL_CAPSULE | ORAL | 0 refills | Status: DC

## 2019-02-19 NOTE — Addendum Note (Signed)
Addended by: Silverio Decamp on: 02/19/2019 08:29 AM   Modules accepted: Orders

## 2019-02-19 NOTE — Assessment & Plan Note (Signed)
Persistent slightly elevated TSH, adding T3 and T4 levels. I still suspect this is subclinical hypothyroidism but we will monitor for progression to clinical hypothyroidism.

## 2019-04-14 ENCOUNTER — Other Ambulatory Visit: Payer: Self-pay | Admitting: Sports Medicine

## 2024-07-20 ENCOUNTER — Telehealth: Payer: Self-pay

## 2024-07-20 NOTE — Telephone Encounter (Signed)
 Copied from CRM [id number]. Topic: Appointments - Transfer of Care >> Jul 20, 2024 11:39 AM Amy B wrote: Pt is requesting to transfer FROM: Dr. Curtis Pt is requesting to transfer TO: [place] Gave Reason for requested transfer: provider left practice It is the responsibility of the team the patient would like to transfer to [facility]) to reach out to the patient if for any reason this transfer is not acceptable.

## 2024-07-20 NOTE — Telephone Encounter (Signed)
 Patient is already scheduled to see Benjamin Ballard in dec

## 2024-09-07 ENCOUNTER — Ambulatory Visit: Admitting: Urgent Care

## 2024-09-20 ENCOUNTER — Ambulatory Visit (INDEPENDENT_AMBULATORY_CARE_PROVIDER_SITE_OTHER): Admitting: Urgent Care

## 2024-09-20 ENCOUNTER — Encounter: Payer: Self-pay | Admitting: Urgent Care

## 2024-09-20 VITALS — BP 140/88 | HR 97 | Ht 73.0 in | Wt 193.0 lb

## 2024-09-20 DIAGNOSIS — E782 Mixed hyperlipidemia: Secondary | ICD-10-CM | POA: Diagnosis not present

## 2024-09-20 DIAGNOSIS — Z0000 Encounter for general adult medical examination without abnormal findings: Secondary | ICD-10-CM

## 2024-09-20 DIAGNOSIS — Z833 Family history of diabetes mellitus: Secondary | ICD-10-CM | POA: Diagnosis not present

## 2024-09-20 DIAGNOSIS — Z87898 Personal history of other specified conditions: Secondary | ICD-10-CM | POA: Diagnosis not present

## 2024-09-20 DIAGNOSIS — Z23 Encounter for immunization: Secondary | ICD-10-CM | POA: Diagnosis not present

## 2024-09-20 DIAGNOSIS — R030 Elevated blood-pressure reading, without diagnosis of hypertension: Secondary | ICD-10-CM

## 2024-09-20 DIAGNOSIS — Z1211 Encounter for screening for malignant neoplasm of colon: Secondary | ICD-10-CM

## 2024-09-20 MED ORDER — SHINGRIX 50 MCG/0.5ML IM SUSR: 0.5000 mL | Freq: Once | INTRAMUSCULAR | 0 refills | Status: AC

## 2024-09-20 NOTE — Patient Instructions (Addendum)
 We drew your labs today. Please update your colon cancer screening.  Please consider updating your Tdap and pneumonia shot at the pharmacy. Shingles vaccine will also need to be administered at the pharmacy.  Please return in two weeks for your blood pressure recheck. Bring your home cuff to ensure its accuracy. Please also bring your home BP log.

## 2024-09-20 NOTE — Progress Notes (Signed)
 "  New Patient Office Visit  Subjective:  Patient ID: Benjamin Ballard, male    DOB: 1956-06-18  Age: 69 y.o. MRN: [id number]  CC:  Chief Complaint  Patient presents with   Establish Care    Discuss shingles vaccine, get tested for diabetes (fasting)    HPI Benjamin Ballard presents to establish care.  He has no known chronic medical conditions. He has not seen a doctor in 6 years as he overall feels well with no complaints. He eats a healthy diet and is physically active. He makes knives for a hobby. He is retired. He sleeps well. He takes no Rx medications and no OTC medications routinely.   He does have a family history of diabetes and would like to be screened for this.   We reviewed his vaccines. He would like to update shingles vaccine, but does not want other vaccines at this time. He has never had a colonoscopy but is amenable to the cologuard.  BP was slightly elevated today in office. Pt denies any prior hx of HTN. States he has a home cuff and monitors with avg readings around 124/76.    Outpatient Encounter Medications as of 09/20/2024  Medication Sig   ascorbic acid (VITAMIN C) 1000 MG tablet Take 1,000 mg by mouth.   LYSINE PO Take by mouth.   SHINGRIX  injection Inject 0.5 mLs into the muscle once for 1 dose. Repeat in 2 months   fluticasone  (FLONASE ) 50 MCG/ACT nasal spray One spray in each nostril twice a day, use left hand for right nostril, and right hand for left nostril. (Patient not taking: Reported on 09/20/2024)   [DISCONTINUED] Vitamin D , Ergocalciferol , (DRISDOL ) 1.25 MG (50000 UT) CAPS capsule Take 1 capsule (50,000 Units total) by mouth every 7 (seven) days. Take for 8 total doses(weeks)   No facility-administered encounter medications on file as of 09/20/2024.    Past Medical History:  Diagnosis Date   Arthritis    oa   Chilaiditi's sign 11/06/2017   Asymptomatic, incidental finding on CXR   GERD (gastroesophageal reflux disease)    no recent reflux meds     Past Surgical History:  Procedure Laterality Date   JOINT REPLACEMENT  03-2011   right hip   TOTAL HIP ARTHROPLASTY Left 08/25/2013   Procedure: LEFT TOTAL HIP ARTHROPLASTY ANTERIOR APPROACH;  Surgeon: Dempsey LULLA Moan, MD;  Location: [facility];  Service: Orthopedics;  Laterality: Left;    Family History  Problem Relation Age of Onset   Cancer Father        COLON    Social History   Socioeconomic History   Marital status: Married    Spouse name: Not on file   Number of children: Not on file   Years of education: Not on file   Highest education level: Not on file  Occupational History   Not on file  Tobacco Use   Smoking status: Passive Smoke Exposure - Never Smoker   Smokeless tobacco: Never  Substance and Sexual Activity   Alcohol use: Yes    Comment: occasional   Drug use: No   Sexual activity: Not on file  Other Topics Concern   Not on file  Social History Narrative   Not on file   Social Drivers of Health   Tobacco Use: Medium Risk (09/20/2024)   Patient History    Smoking Tobacco Use: Passive Smoke Exposure - Never Smoker    Smokeless Tobacco Use: Never    Passive Exposure: Yes  Financial Resource Strain: Not on file  Food Insecurity: Not on file  Transportation Needs: Not on file  Physical Activity: Not on file  Stress: Not on file  Social Connections: Not on file  Intimate Partner Violence: Not on file  Depression (PHQ2-9): Low Risk (09/20/2024)   Depression (PHQ2-9)    PHQ-2 Score: 0  Alcohol Screen: Not on file  Housing: Not on file  Utilities: Not on file  Health Literacy: Not on file    ROS: as noted in HPI  Objective:  BP (!) 140/88   Pulse 97   Ht 6' 1 (1.854 m)   Wt 193 lb (87.5 kg)   SpO2 99%   BMI 25.46 kg/m   Physical Exam Vitals and nursing note reviewed.  Constitutional:      General: He is not in acute distress.    Appearance: Normal appearance. He is not ill-appearing, toxic-appearing or diaphoretic.  HENT:     Head:  Normocephalic and atraumatic.     Right Ear: Tympanic membrane, ear canal and external ear normal. There is no impacted cerumen.     Left Ear: Tympanic membrane, ear canal and external ear normal. There is no impacted cerumen.     Nose: Nose normal.     Mouth/Throat:     Mouth: Mucous membranes are moist.     Pharynx: Oropharynx is clear. No oropharyngeal exudate or posterior oropharyngeal erythema.  Eyes:     General: No scleral icterus.       Right eye: No discharge.        Left eye: No discharge.     Extraocular Movements: Extraocular movements intact.     Pupils: Pupils are equal, round, and reactive to light.  Neck:     Thyroid: No thyroid mass, thyromegaly or thyroid tenderness.  Cardiovascular:     Rate and Rhythm: Normal rate and regular rhythm.     Pulses: Normal pulses.     Heart sounds: No murmur heard. Pulmonary:     Effort: Pulmonary effort is normal. No respiratory distress.     Breath sounds: Normal breath sounds. No stridor. No wheezing or rhonchi.  Abdominal:     General: Abdomen is flat. Bowel sounds are normal. There is no distension.     Palpations: Abdomen is soft. There is no mass.     Tenderness: There is no abdominal tenderness. There is no guarding.  Musculoskeletal:     Cervical back: Normal range of motion and neck supple. No rigidity or tenderness.     Right lower leg: No edema.     Left lower leg: No edema.  Lymphadenopathy:     Cervical: No cervical adenopathy.  Skin:    General: Skin is warm and dry.     Coloration: Skin is not jaundiced.     Findings: No bruising, erythema or rash.  Neurological:     General: No focal deficit present.     Mental Status: He is alert and oriented to person, place, and time.     Sensory: No sensory deficit.     Motor: No weakness.  Psychiatric:        Mood and Affect: Mood normal.        Behavior: Behavior normal.       09/20/2024   10:33 AM  Depression screen PHQ 2/9  Decreased Interest 0  Down,  Depressed, Hopeless 0  PHQ - 2 Score 0  Altered sleeping 0  Tired, decreased energy 0  Change in appetite 0  Feeling bad or failure about yourself  0  Trouble concentrating 0  Moving slowly or fidgety/restless 0  Suicidal thoughts 0  PHQ-9 Score 0  Difficult doing work/chores Not difficult at all      09/20/2024   10:33 AM  GAD 7 : Generalized Anxiety Score  Nervous, Anxious, on Edge 0  Control/stop worrying 0  Worry too much - different things 0  Trouble relaxing 0  Restless 0  Easily annoyed or irritable 0  Afraid - awful might happen 0  Total GAD 7 Score 0  Anxiety Difficulty Not difficult at all      Last CBC Lab Results  Component Value Date   WBC 4.5 02/18/2019   HGB 15.1 02/18/2019   HCT 44.9 02/18/2019   MCV 91.3 02/18/2019   MCH 30.7 02/18/2019   RDW 12.8 02/18/2019   PLT 273 02/18/2019   Last metabolic panel Lab Results  Component Value Date   GLUCOSE 94 02/18/2019   NA 140 02/18/2019   K 4.2 02/18/2019   CL 103 02/18/2019   CO2 26 02/18/2019   BUN 17 02/18/2019   CREATININE 1.20 02/18/2019   GFRNONAA 64 02/18/2019   CALCIUM  9.3 02/18/2019   PROT 7.1 02/18/2019   ALBUMIN 4.3 08/18/2013   BILITOT 0.6 02/18/2019   ALKPHOS 92 08/18/2013   AST 31 02/18/2019   ALT 31 02/18/2019   Last lipids Lab Results  Component Value Date   CHOL 193 02/18/2019   HDL 35 (L) 02/18/2019   LDLCALC 131 (H) 02/18/2019   TRIG 154 (H) 02/18/2019   CHOLHDL 5.5 (H) 02/18/2019   Last hemoglobin A1c Lab Results  Component Value Date   HGBA1C 5.7 (H) 02/18/2019   Last thyroid functions Lab Results  Component Value Date   TSH 4.67 (H) 02/18/2019   FREET4 1.1 02/18/2019   Last vitamin D  Lab Results  Component Value Date   VD25OH 28 (L) 02/18/2019   Last vitamin B12 and Folate No results found for: VITAMINB12, FOLATE    Assessment & Plan:  Routine adult health maintenance -     CBC with Differential/Platelet -     Hemoglobin A1c -     TSH -      Lipid panel -     Comprehensive metabolic panel with GFR -     PSA  Family history of diabetes mellitus -     Hemoglobin A1c -     Comprehensive metabolic panel with GFR  Elevated blood-pressure reading without diagnosis of hypertension  Immunization due -     Shingrix ; Inject 0.5 mLs into the muscle once for 1 dose. Repeat in 2 months  Dispense: 0.5 mL; Refill: 0  Screen for colon cancer -     Cologuard  History of prediabetes  Mixed hyperlipidemia   Routine exam completed today. Cologuard test ordered. Elevated BP noted compared to pt's home BP readings. Pt to RTC in 2 weeks for BP recheck, and bring home BP cuff.  Hx of abnormal TSH, lipids and A1C in 2020. Will recheck fasting labs today.  Pt deferred all vaccines apart from shingles today. Will need to repeat second in 2 months.  Return in about 1 year (around 09/20/2025) for Annual Physical.   Benton LITTIE Gave, PA  "

## 2024-09-21 ENCOUNTER — Ambulatory Visit: Payer: Self-pay | Admitting: Urgent Care

## 2024-09-21 DIAGNOSIS — E782 Mixed hyperlipidemia: Secondary | ICD-10-CM

## 2024-09-21 LAB — COMPREHENSIVE METABOLIC PANEL WITH GFR
ALT: 28 IU/L (ref 0–44)
AST: 29 IU/L (ref 0–40)
Albumin: 4.8 g/dL (ref 3.9–4.9)
Alkaline Phosphatase: 78 IU/L (ref 47–123)
BUN/Creatinine Ratio: 18 (ref 10–24)
BUN: 24 mg/dL (ref 8–27)
Bilirubin Total: 0.6 mg/dL (ref 0.0–1.2)
CO2: 21 mmol/L (ref 20–29)
Calcium: 9.9 mg/dL (ref 8.6–10.2)
Chloride: 101 mmol/L (ref 96–106)
Creatinine, Ser: 1.37 mg/dL — ABNORMAL HIGH (ref 0.76–1.27)
Globulin, Total: 2.7 g/dL (ref 1.5–4.5)
Glucose: 118 mg/dL — ABNORMAL HIGH (ref 70–99)
Potassium: 4.5 mmol/L (ref 3.5–5.2)
Sodium: 138 mmol/L (ref 134–144)
Total Protein: 7.5 g/dL (ref 6.0–8.5)
eGFR: 57 mL/min/1.73 — ABNORMAL LOW

## 2024-09-21 LAB — CBC WITH DIFFERENTIAL/PLATELET
Basophils Absolute: 0.1 x10E3/uL (ref 0.0–0.2)
Basos: 2 %
EOS (ABSOLUTE): 0.1 x10E3/uL (ref 0.0–0.4)
Eos: 2 %
Hematocrit: 48 % (ref 37.5–51.0)
Hemoglobin: 15.9 g/dL (ref 13.0–17.7)
Immature Grans (Abs): 0 x10E3/uL (ref 0.0–0.1)
Immature Granulocytes: 0 %
Lymphocytes Absolute: 0.8 x10E3/uL (ref 0.7–3.1)
Lymphs: 18 %
MCH: 32.1 pg (ref 26.6–33.0)
MCHC: 33.1 g/dL (ref 31.5–35.7)
MCV: 97 fL (ref 79–97)
Monocytes Absolute: 0.5 x10E3/uL (ref 0.1–0.9)
Monocytes: 11 %
Neutrophils Absolute: 2.8 x10E3/uL (ref 1.4–7.0)
Neutrophils: 67 %
Platelets: 237 x10E3/uL (ref 150–450)
RBC: 4.96 x10E6/uL (ref 4.14–5.80)
RDW: 12.6 % (ref 11.6–15.4)
WBC: 4.3 x10E3/uL (ref 3.4–10.8)

## 2024-09-21 LAB — LIPID PANEL
Chol/HDL Ratio: 5.3 ratio — ABNORMAL HIGH (ref 0.0–5.0)
Cholesterol, Total: 231 mg/dL — ABNORMAL HIGH (ref 100–199)
HDL: 44 mg/dL
LDL Chol Calc (NIH): 166 mg/dL — ABNORMAL HIGH (ref 0–99)
Triglycerides: 117 mg/dL (ref 0–149)
VLDL Cholesterol Cal: 21 mg/dL (ref 5–40)

## 2024-09-21 LAB — PSA: Prostate Specific Ag, Serum: 0.3 ng/mL (ref 0.0–4.0)

## 2024-09-21 LAB — HEMOGLOBIN A1C
Est. average glucose Bld gHb Est-mCnc: 123 mg/dL
Hgb A1c MFr Bld: 5.9 % — ABNORMAL HIGH (ref 4.8–5.6)

## 2024-09-21 LAB — TSH: TSH: 8.13 u[IU]/mL — ABNORMAL HIGH (ref 0.450–4.500)

## 2024-09-21 MED ORDER — ATORVASTATIN CALCIUM 20 MG PO TABS: 20.0000 mg | ORAL_TABLET | Freq: Every day | ORAL | 3 refills | Status: AC

## 2024-09-24 NOTE — Telephone Encounter (Signed)
 Results letter placed in mail to patient with message for return call to schedule a 2 week appointment.
